# Patient Record
Sex: Female | Born: 1935 | Hispanic: No | Smoking: Former smoker
Health system: Southern US, Community
[De-identification: ages and names within clinical notes are randomized; demographics above are authoritative.]

## PROBLEM LIST (undated history)

## (undated) DIAGNOSIS — J449 Chronic obstructive pulmonary disease, unspecified: Secondary | ICD-10-CM

## (undated) DIAGNOSIS — C3490 Malignant neoplasm of unspecified part of unspecified bronchus or lung: Secondary | ICD-10-CM

## (undated) DIAGNOSIS — G629 Polyneuropathy, unspecified: Secondary | ICD-10-CM

## (undated) HISTORY — PX: VAGINAL HYSTERECTOMY: SUR661

## (undated) HISTORY — DX: Chronic obstructive pulmonary disease, unspecified: J44.9

## (undated) HISTORY — DX: Polyneuropathy, unspecified: G62.9

## (undated) HISTORY — DX: Malignant neoplasm of unspecified part of unspecified bronchus or lung: C34.90

## (undated) HISTORY — PX: OTHER SURGICAL HISTORY: SHX169

---

## 1990-04-28 HISTORY — PX: OTHER SURGICAL HISTORY: SHX169

## 1992-11-26 HISTORY — PX: OTHER SURGICAL HISTORY: SHX169

## 2013-05-02 DIAGNOSIS — I4891 Unspecified atrial fibrillation: Secondary | ICD-10-CM | POA: Diagnosis not present

## 2013-05-03 DIAGNOSIS — J449 Chronic obstructive pulmonary disease, unspecified: Secondary | ICD-10-CM | POA: Diagnosis not present

## 2013-05-03 DIAGNOSIS — C3430 Malignant neoplasm of lower lobe, unspecified bronchus or lung: Secondary | ICD-10-CM | POA: Diagnosis not present

## 2013-05-03 DIAGNOSIS — I4891 Unspecified atrial fibrillation: Secondary | ICD-10-CM | POA: Diagnosis not present

## 2013-06-02 DIAGNOSIS — H409 Unspecified glaucoma: Secondary | ICD-10-CM | POA: Diagnosis not present

## 2013-06-02 DIAGNOSIS — H4011X0 Primary open-angle glaucoma, stage unspecified: Secondary | ICD-10-CM | POA: Diagnosis not present

## 2013-07-19 DIAGNOSIS — J309 Allergic rhinitis, unspecified: Secondary | ICD-10-CM | POA: Diagnosis not present

## 2013-07-21 DIAGNOSIS — J209 Acute bronchitis, unspecified: Secondary | ICD-10-CM | POA: Diagnosis not present

## 2013-08-02 DIAGNOSIS — C3430 Malignant neoplasm of lower lobe, unspecified bronchus or lung: Secondary | ICD-10-CM | POA: Diagnosis not present

## 2013-08-02 DIAGNOSIS — J449 Chronic obstructive pulmonary disease, unspecified: Secondary | ICD-10-CM | POA: Diagnosis not present

## 2013-08-02 DIAGNOSIS — Z79899 Other long term (current) drug therapy: Secondary | ICD-10-CM | POA: Diagnosis not present

## 2013-08-12 DIAGNOSIS — E7800 Pure hypercholesterolemia, unspecified: Secondary | ICD-10-CM | POA: Diagnosis not present

## 2013-08-12 DIAGNOSIS — C3430 Malignant neoplasm of lower lobe, unspecified bronchus or lung: Secondary | ICD-10-CM | POA: Diagnosis not present

## 2013-08-12 DIAGNOSIS — I4891 Unspecified atrial fibrillation: Secondary | ICD-10-CM | POA: Diagnosis not present

## 2013-08-12 DIAGNOSIS — J9801 Acute bronchospasm: Secondary | ICD-10-CM | POA: Diagnosis not present

## 2013-08-22 DIAGNOSIS — R823 Hemoglobinuria: Secondary | ICD-10-CM | POA: Diagnosis not present

## 2013-08-22 DIAGNOSIS — R3129 Other microscopic hematuria: Secondary | ICD-10-CM | POA: Diagnosis not present

## 2013-09-08 DIAGNOSIS — H409 Unspecified glaucoma: Secondary | ICD-10-CM | POA: Diagnosis not present

## 2013-09-08 DIAGNOSIS — H4011X0 Primary open-angle glaucoma, stage unspecified: Secondary | ICD-10-CM | POA: Diagnosis not present

## 2013-09-15 DIAGNOSIS — G609 Hereditary and idiopathic neuropathy, unspecified: Secondary | ICD-10-CM | POA: Diagnosis not present

## 2013-09-15 DIAGNOSIS — R209 Unspecified disturbances of skin sensation: Secondary | ICD-10-CM | POA: Diagnosis not present

## 2013-09-18 DIAGNOSIS — R51 Headache: Secondary | ICD-10-CM | POA: Diagnosis not present

## 2013-09-26 DIAGNOSIS — B029 Zoster without complications: Secondary | ICD-10-CM | POA: Diagnosis not present

## 2013-10-03 DIAGNOSIS — K7689 Other specified diseases of liver: Secondary | ICD-10-CM | POA: Diagnosis not present

## 2013-10-03 DIAGNOSIS — R222 Localized swelling, mass and lump, trunk: Secondary | ICD-10-CM | POA: Diagnosis not present

## 2013-10-03 DIAGNOSIS — N35919 Unspecified urethral stricture, male, unspecified site: Secondary | ICD-10-CM | POA: Diagnosis not present

## 2013-10-03 DIAGNOSIS — R3129 Other microscopic hematuria: Secondary | ICD-10-CM | POA: Diagnosis not present

## 2013-10-03 DIAGNOSIS — J984 Other disorders of lung: Secondary | ICD-10-CM | POA: Diagnosis not present

## 2013-10-05 DIAGNOSIS — H66009 Acute suppurative otitis media without spontaneous rupture of ear drum, unspecified ear: Secondary | ICD-10-CM | POA: Diagnosis not present

## 2013-10-05 DIAGNOSIS — J019 Acute sinusitis, unspecified: Secondary | ICD-10-CM | POA: Diagnosis not present

## 2013-10-21 DIAGNOSIS — G609 Hereditary and idiopathic neuropathy, unspecified: Secondary | ICD-10-CM | POA: Diagnosis not present

## 2013-10-21 DIAGNOSIS — R209 Unspecified disturbances of skin sensation: Secondary | ICD-10-CM | POA: Diagnosis not present

## 2013-10-26 DIAGNOSIS — B029 Zoster without complications: Secondary | ICD-10-CM | POA: Diagnosis not present

## 2013-11-01 DIAGNOSIS — C3490 Malignant neoplasm of unspecified part of unspecified bronchus or lung: Secondary | ICD-10-CM | POA: Diagnosis not present

## 2013-11-01 DIAGNOSIS — R222 Localized swelling, mass and lump, trunk: Secondary | ICD-10-CM | POA: Diagnosis not present

## 2013-11-01 DIAGNOSIS — J449 Chronic obstructive pulmonary disease, unspecified: Secondary | ICD-10-CM | POA: Diagnosis not present

## 2013-11-14 DIAGNOSIS — N35919 Unspecified urethral stricture, male, unspecified site: Secondary | ICD-10-CM | POA: Diagnosis not present

## 2013-11-22 DIAGNOSIS — I4891 Unspecified atrial fibrillation: Secondary | ICD-10-CM | POA: Diagnosis not present

## 2013-11-22 DIAGNOSIS — E785 Hyperlipidemia, unspecified: Secondary | ICD-10-CM | POA: Diagnosis not present

## 2013-11-22 DIAGNOSIS — J449 Chronic obstructive pulmonary disease, unspecified: Secondary | ICD-10-CM | POA: Diagnosis not present

## 2013-12-21 DIAGNOSIS — R209 Unspecified disturbances of skin sensation: Secondary | ICD-10-CM | POA: Diagnosis not present

## 2013-12-21 DIAGNOSIS — G609 Hereditary and idiopathic neuropathy, unspecified: Secondary | ICD-10-CM | POA: Diagnosis not present

## 2013-12-27 DIAGNOSIS — C3430 Malignant neoplasm of lower lobe, unspecified bronchus or lung: Secondary | ICD-10-CM | POA: Diagnosis not present

## 2013-12-27 DIAGNOSIS — I4891 Unspecified atrial fibrillation: Secondary | ICD-10-CM | POA: Diagnosis not present

## 2013-12-27 DIAGNOSIS — Z79899 Other long term (current) drug therapy: Secondary | ICD-10-CM | POA: Diagnosis not present

## 2013-12-27 DIAGNOSIS — J449 Chronic obstructive pulmonary disease, unspecified: Secondary | ICD-10-CM | POA: Diagnosis not present

## 2014-01-20 DIAGNOSIS — H353190 Nonexudative age-related macular degeneration, unspecified eye, stage unspecified: Secondary | ICD-10-CM | POA: Diagnosis not present

## 2014-01-20 DIAGNOSIS — H4011X0 Primary open-angle glaucoma, stage unspecified: Secondary | ICD-10-CM | POA: Diagnosis not present

## 2014-01-20 DIAGNOSIS — H409 Unspecified glaucoma: Secondary | ICD-10-CM | POA: Diagnosis not present

## 2014-01-26 DIAGNOSIS — I4891 Unspecified atrial fibrillation: Secondary | ICD-10-CM | POA: Diagnosis not present

## 2014-02-10 DIAGNOSIS — Z1231 Encounter for screening mammogram for malignant neoplasm of breast: Secondary | ICD-10-CM | POA: Diagnosis not present

## 2014-02-20 DIAGNOSIS — R312 Other microscopic hematuria: Secondary | ICD-10-CM | POA: Diagnosis not present

## 2014-03-01 DIAGNOSIS — I4891 Unspecified atrial fibrillation: Secondary | ICD-10-CM | POA: Diagnosis not present

## 2014-03-01 DIAGNOSIS — E780 Pure hypercholesterolemia: Secondary | ICD-10-CM | POA: Diagnosis not present

## 2014-03-01 DIAGNOSIS — J449 Chronic obstructive pulmonary disease, unspecified: Secondary | ICD-10-CM | POA: Diagnosis not present

## 2014-03-01 DIAGNOSIS — Z23 Encounter for immunization: Secondary | ICD-10-CM | POA: Diagnosis not present

## 2014-03-01 DIAGNOSIS — Z0000 Encounter for general adult medical examination without abnormal findings: Secondary | ICD-10-CM | POA: Diagnosis not present

## 2014-03-01 DIAGNOSIS — E559 Vitamin D deficiency, unspecified: Secondary | ICD-10-CM | POA: Diagnosis not present

## 2014-03-28 DIAGNOSIS — J449 Chronic obstructive pulmonary disease, unspecified: Secondary | ICD-10-CM | POA: Diagnosis not present

## 2014-03-28 DIAGNOSIS — C3430 Malignant neoplasm of lower lobe, unspecified bronchus or lung: Secondary | ICD-10-CM | POA: Diagnosis not present

## 2014-03-30 DIAGNOSIS — H401430 Capsular glaucoma with pseudoexfoliation of lens, bilateral, stage unspecified: Secondary | ICD-10-CM | POA: Diagnosis not present

## 2014-03-30 DIAGNOSIS — Q100 Congenital ptosis: Secondary | ICD-10-CM | POA: Diagnosis not present

## 2014-03-30 DIAGNOSIS — H4011X3 Primary open-angle glaucoma, severe stage: Secondary | ICD-10-CM | POA: Diagnosis not present

## 2014-04-06 DIAGNOSIS — H02421 Myogenic ptosis of right eyelid: Secondary | ICD-10-CM | POA: Diagnosis not present

## 2014-04-10 DIAGNOSIS — H4011X3 Primary open-angle glaucoma, severe stage: Secondary | ICD-10-CM | POA: Diagnosis not present

## 2014-06-01 DIAGNOSIS — N3010 Interstitial cystitis (chronic) without hematuria: Secondary | ICD-10-CM | POA: Diagnosis not present

## 2014-06-01 DIAGNOSIS — R300 Dysuria: Secondary | ICD-10-CM | POA: Diagnosis not present

## 2014-06-01 DIAGNOSIS — R358 Other polyuria: Secondary | ICD-10-CM | POA: Diagnosis not present

## 2014-06-19 DIAGNOSIS — H02411 Mechanical ptosis of right eyelid: Secondary | ICD-10-CM | POA: Diagnosis not present

## 2014-06-19 DIAGNOSIS — H4011X3 Primary open-angle glaucoma, severe stage: Secondary | ICD-10-CM | POA: Diagnosis not present

## 2014-06-21 DIAGNOSIS — I480 Paroxysmal atrial fibrillation: Secondary | ICD-10-CM | POA: Diagnosis not present

## 2014-07-03 DIAGNOSIS — H02421 Myogenic ptosis of right eyelid: Secondary | ICD-10-CM | POA: Diagnosis not present

## 2014-07-05 DIAGNOSIS — T161XXA Foreign body in right ear, initial encounter: Secondary | ICD-10-CM | POA: Diagnosis not present

## 2014-07-14 DIAGNOSIS — N3010 Interstitial cystitis (chronic) without hematuria: Secondary | ICD-10-CM | POA: Diagnosis not present

## 2014-07-26 DIAGNOSIS — M9903 Segmental and somatic dysfunction of lumbar region: Secondary | ICD-10-CM | POA: Diagnosis not present

## 2014-07-26 DIAGNOSIS — M9904 Segmental and somatic dysfunction of sacral region: Secondary | ICD-10-CM | POA: Diagnosis not present

## 2014-07-26 DIAGNOSIS — M9905 Segmental and somatic dysfunction of pelvic region: Secondary | ICD-10-CM | POA: Diagnosis not present

## 2014-07-26 DIAGNOSIS — M5136 Other intervertebral disc degeneration, lumbar region: Secondary | ICD-10-CM | POA: Diagnosis not present

## 2014-07-27 DIAGNOSIS — M9903 Segmental and somatic dysfunction of lumbar region: Secondary | ICD-10-CM | POA: Diagnosis not present

## 2014-07-27 DIAGNOSIS — M5136 Other intervertebral disc degeneration, lumbar region: Secondary | ICD-10-CM | POA: Diagnosis not present

## 2014-07-27 DIAGNOSIS — M9904 Segmental and somatic dysfunction of sacral region: Secondary | ICD-10-CM | POA: Diagnosis not present

## 2014-07-27 DIAGNOSIS — M9905 Segmental and somatic dysfunction of pelvic region: Secondary | ICD-10-CM | POA: Diagnosis not present

## 2014-07-31 DIAGNOSIS — M9904 Segmental and somatic dysfunction of sacral region: Secondary | ICD-10-CM | POA: Diagnosis not present

## 2014-07-31 DIAGNOSIS — M9905 Segmental and somatic dysfunction of pelvic region: Secondary | ICD-10-CM | POA: Diagnosis not present

## 2014-07-31 DIAGNOSIS — M9903 Segmental and somatic dysfunction of lumbar region: Secondary | ICD-10-CM | POA: Diagnosis not present

## 2014-07-31 DIAGNOSIS — M5136 Other intervertebral disc degeneration, lumbar region: Secondary | ICD-10-CM | POA: Diagnosis not present

## 2014-08-01 DIAGNOSIS — J449 Chronic obstructive pulmonary disease, unspecified: Secondary | ICD-10-CM | POA: Diagnosis not present

## 2014-08-01 DIAGNOSIS — C3430 Malignant neoplasm of lower lobe, unspecified bronchus or lung: Secondary | ICD-10-CM | POA: Diagnosis not present

## 2014-08-03 DIAGNOSIS — M9904 Segmental and somatic dysfunction of sacral region: Secondary | ICD-10-CM | POA: Diagnosis not present

## 2014-08-03 DIAGNOSIS — M9903 Segmental and somatic dysfunction of lumbar region: Secondary | ICD-10-CM | POA: Diagnosis not present

## 2014-08-03 DIAGNOSIS — M9905 Segmental and somatic dysfunction of pelvic region: Secondary | ICD-10-CM | POA: Diagnosis not present

## 2014-08-03 DIAGNOSIS — M5136 Other intervertebral disc degeneration, lumbar region: Secondary | ICD-10-CM | POA: Diagnosis not present

## 2014-08-10 DIAGNOSIS — M5136 Other intervertebral disc degeneration, lumbar region: Secondary | ICD-10-CM | POA: Diagnosis not present

## 2014-08-10 DIAGNOSIS — M9904 Segmental and somatic dysfunction of sacral region: Secondary | ICD-10-CM | POA: Diagnosis not present

## 2014-08-10 DIAGNOSIS — M9905 Segmental and somatic dysfunction of pelvic region: Secondary | ICD-10-CM | POA: Diagnosis not present

## 2014-08-10 DIAGNOSIS — M9903 Segmental and somatic dysfunction of lumbar region: Secondary | ICD-10-CM | POA: Diagnosis not present

## 2014-08-30 DIAGNOSIS — E780 Pure hypercholesterolemia: Secondary | ICD-10-CM | POA: Diagnosis not present

## 2014-08-30 DIAGNOSIS — E559 Vitamin D deficiency, unspecified: Secondary | ICD-10-CM | POA: Diagnosis not present

## 2014-08-30 DIAGNOSIS — I10 Essential (primary) hypertension: Secondary | ICD-10-CM | POA: Diagnosis not present

## 2014-09-04 DIAGNOSIS — M5136 Other intervertebral disc degeneration, lumbar region: Secondary | ICD-10-CM | POA: Diagnosis not present

## 2014-09-04 DIAGNOSIS — M9904 Segmental and somatic dysfunction of sacral region: Secondary | ICD-10-CM | POA: Diagnosis not present

## 2014-09-04 DIAGNOSIS — M9903 Segmental and somatic dysfunction of lumbar region: Secondary | ICD-10-CM | POA: Diagnosis not present

## 2014-09-04 DIAGNOSIS — M9905 Segmental and somatic dysfunction of pelvic region: Secondary | ICD-10-CM | POA: Diagnosis not present

## 2014-09-05 DIAGNOSIS — M5136 Other intervertebral disc degeneration, lumbar region: Secondary | ICD-10-CM | POA: Diagnosis not present

## 2014-09-05 DIAGNOSIS — M9903 Segmental and somatic dysfunction of lumbar region: Secondary | ICD-10-CM | POA: Diagnosis not present

## 2014-09-05 DIAGNOSIS — M9905 Segmental and somatic dysfunction of pelvic region: Secondary | ICD-10-CM | POA: Diagnosis not present

## 2014-09-05 DIAGNOSIS — M9904 Segmental and somatic dysfunction of sacral region: Secondary | ICD-10-CM | POA: Diagnosis not present

## 2014-09-06 DIAGNOSIS — M5136 Other intervertebral disc degeneration, lumbar region: Secondary | ICD-10-CM | POA: Diagnosis not present

## 2014-09-06 DIAGNOSIS — M9904 Segmental and somatic dysfunction of sacral region: Secondary | ICD-10-CM | POA: Diagnosis not present

## 2014-09-06 DIAGNOSIS — M9905 Segmental and somatic dysfunction of pelvic region: Secondary | ICD-10-CM | POA: Diagnosis not present

## 2014-09-06 DIAGNOSIS — M9903 Segmental and somatic dysfunction of lumbar region: Secondary | ICD-10-CM | POA: Diagnosis not present

## 2014-10-05 DIAGNOSIS — H02413 Mechanical ptosis of bilateral eyelids: Secondary | ICD-10-CM | POA: Diagnosis not present

## 2014-10-13 DIAGNOSIS — N3010 Interstitial cystitis (chronic) without hematuria: Secondary | ICD-10-CM | POA: Diagnosis not present

## 2014-11-03 ENCOUNTER — Ambulatory Visit (INDEPENDENT_AMBULATORY_CARE_PROVIDER_SITE_OTHER): Payer: Medicare Other | Admitting: Pulmonary Disease

## 2014-11-03 ENCOUNTER — Encounter (INDEPENDENT_AMBULATORY_CARE_PROVIDER_SITE_OTHER): Payer: Self-pay

## 2014-11-03 ENCOUNTER — Encounter: Payer: Self-pay | Admitting: Pulmonary Disease

## 2014-11-03 VITALS — BP 124/68 | HR 81 | Ht 63.0 in | Wt 135.0 lb

## 2014-11-03 DIAGNOSIS — G609 Hereditary and idiopathic neuropathy, unspecified: Secondary | ICD-10-CM

## 2014-11-03 DIAGNOSIS — J479 Bronchiectasis, uncomplicated: Secondary | ICD-10-CM | POA: Diagnosis not present

## 2014-11-03 DIAGNOSIS — J439 Emphysema, unspecified: Secondary | ICD-10-CM | POA: Insufficient documentation

## 2014-11-03 DIAGNOSIS — M792 Neuralgia and neuritis, unspecified: Secondary | ICD-10-CM | POA: Insufficient documentation

## 2014-11-03 DIAGNOSIS — C3400 Malignant neoplasm of unspecified main bronchus: Secondary | ICD-10-CM | POA: Diagnosis not present

## 2014-11-03 DIAGNOSIS — J438 Other emphysema: Secondary | ICD-10-CM | POA: Diagnosis not present

## 2014-11-03 DIAGNOSIS — C3490 Malignant neoplasm of unspecified part of unspecified bronchus or lung: Secondary | ICD-10-CM | POA: Insufficient documentation

## 2014-11-03 MED ORDER — GABAPENTIN 300 MG PO CAPS: 600.0000 mg | ORAL_CAPSULE | Freq: Three times a day (TID) | ORAL | Status: DC

## 2014-11-03 NOTE — Patient Instructions (Signed)
Continue using the pro-air inhaler as needed for shortness of breath We will request records from your prior pulmonologist We will see you back in 4 months or sooner if needed

## 2014-11-03 NOTE — Progress Notes (Signed)
Subjective:    Patient ID: Jocelyn Ramirez, female    DOB: 1929-02-25, 81 y.o.   MRN: [id number]  HPI Chief Complaint  Patient presents with  . Advice Only    Pt establishing care after relocating.  Pt has hx of lung ca.      This is a very pleasant 81 year old lady originally from [place] who comes to my clinic today to establish care for COPD bronchiectasis and lung cancer. She tells me that as a child she had no respiratory illnesses of which she is aware.  Unfortunately she was diagnosed with lung cancer in the 1990s after she had a persistent cough. This was treated with a lobectomy and she says she has not had recurrence since.  She knows she has a diagnosis of COPD but she has minimal symptoms. She says that she only gets short of breath when she climbs multiple hills. Walking on level ground is not difficult for her. She remains quite active. She takes albuterol 3 or 4 times a year at most. She says most inhalers last her more than 18 months at a time. She does not take any other inhaled therapies. She gets bronchitis about one time per year. She has previously been followed by a pulmonologist in a neighboring city.  She has peripheral neuropathy and she is asking that we refill her gabapentin.  She recently [place] to [place].    Past Medical History  Diagnosis Date  . COPD (chronic obstructive pulmonary disease)   . Lung cancer   . Peripheral neuropathy      No family history on file.   History   Social History  . Marital Status: Divorced    Spouse Name: N/A  . Number of Children: N/A  . Years of Education: N/A   Occupational History  . Not on file.   Social History Main Topics  . Smoking status: Former Smoker -- 0.50 packs/day for 35 years    Types: Cigarettes    Quit date: 04/29/1983  . Smokeless tobacco: Never Used  . Alcohol Use: Not on file  . Drug Use: Not on file  . Sexual Activity: Not on file   Other Topics  Concern  . Not on file   Social History Narrative  . No narrative on file     Allergies  Allergen Reactions  . Penicillins      No outpatient prescriptions prior to visit.   No facility-administered medications prior to visit.       Review of Systems  Constitutional: Positive for fatigue. Negative for fever and unexpected weight change.  HENT: Negative for congestion, dental problem, ear pain, nosebleeds, postnasal drip, rhinorrhea, sinus pressure, sneezing, sore throat and trouble swallowing.   Eyes: Negative for redness and itching.  Respiratory: Positive for shortness of breath. Negative for cough, chest tightness and wheezing.   Cardiovascular: Negative for palpitations and leg swelling.  Gastrointestinal: Negative for nausea and vomiting.  Genitourinary: Negative for dysuria.  Musculoskeletal: Negative for joint swelling.  Skin: Negative for rash.  Neurological: Negative for headaches.  Hematological: Does not bruise/bleed easily.  Psychiatric/Behavioral: Negative for dysphoric mood. The patient is not nervous/anxious.        Objective:   Physical Exam Filed Vitals:   11/03/14 1000  BP: 124/68  Pulse: 81  Height: '5\' 3"'$  (1.6 m)  Weight: 135 lb (61.236 kg)  SpO2: 95%  RA  Gen: well appearing, no acute distress HENT: NCAT, OP clear, neck  supple without masses Eyes: PERRL, EOMi Lymph: no cervical lymphadenopathy PULM: Crackles in bases bilaterally, good air movement CV: RRR, no mgr, no JVD GI: BS+, soft, nontender, no hsm Derm: no rash or skin breakdown, trace edema MSK: normal bulk and tone Neuro: A&Ox4, CN II-XII intact, strength 5/5 in all 4 extremities Psyche: normal mood and affect  2016 CT chest images personally reviewed showing emphysema and lower lobe bronchiectasis with scattered nodules. Overall apperance suggestive of MAI     Assessment & Plan:  Lung cancer She has a history of lung cancer treated with a lobectomy in the 1990s. She has not  had evidence of recurrence. We will obtain records from her prior pulmonologist for the most recent imaging.  COPD with emphysema I have personally reviewed the images from a CT chest from this year which shows emphysema as well as lower lobe bronchiectasis and micronodules likely suggestive of a chronic atypical infection. I do not know the severity of her COPD but it sounds as if she has mild symptoms and only one exacerbation per year.  Plan: Obtain records from her pulmonologist for her most recent pulmonary function testing Continue albuterol as needed Flu shot in the fall  Bronchiectasis without acute exacerbation As noted above she has evidence of bronchiectasis but it sounds as if she has mild symptoms at best. She may have one exacerbation per year.  Plan: Obtain records from prior pulmonologist for microbiology results If she produces sputum we will collect a sample for culture Continue as needed albuterol.  Peripheral neuralgia She is requesting a refill on her gabapentin. I will refill it at her current dosing and I have asked her to follow-up with her primary care physician regarding this.     Current outpatient prescriptions:  .  atorvastatin (LIPITOR) 10 MG tablet, Take 10 mg by mouth daily., Disp: , Rfl:  .  Brinzolamide-Brimonidine (SIMBRINZA) 1-0.2 % SUSP, Apply 1 drop to eye 3 (three) times daily., Disp: , Rfl:  .  dabigatran (PRADAXA) 150 MG CAPS capsule, Take 150 mg by mouth 2 (two) times daily., Disp: , Rfl:  .  diazepam (VALIUM) 10 MG tablet, Take 10 mg by mouth daily., Disp: , Rfl:  .  gabapentin (NEURONTIN) 300 MG capsule, Take 2 capsules (600 mg total) by mouth 3 (three) times daily., Disp: 180 capsule, Rfl: 1 .  oxybutynin (DITROPAN) 5 MG tablet, Take 5 mg by mouth daily., Disp: , Rfl:  .  timolol (TIMOPTIC) 0.25 % ophthalmic solution, Place 1 drop into both eyes 2 (two) times daily., Disp: , Rfl:  .  Travoprost, BAK Free, (TRAVATAN) 0.004 % SOLN ophthalmic  solution, Place 1 drop into both eyes at bedtime., Disp: , Rfl:

## 2014-11-03 NOTE — Assessment & Plan Note (Signed)
I have personally reviewed the images from a CT chest from this year which shows emphysema as well as lower lobe bronchiectasis and micronodules likely suggestive of a chronic atypical infection. I do not know the severity of her COPD but it sounds as if she has mild symptoms and only one exacerbation per year.  Plan: Obtain records from her pulmonologist for her most recent pulmonary function testing Continue albuterol as needed Flu shot in the fall

## 2014-11-03 NOTE — Assessment & Plan Note (Signed)
She is requesting a refill on her gabapentin. I will refill it at her current dosing and I have asked her to follow-up with her primary care physician regarding this.

## 2014-11-03 NOTE — Assessment & Plan Note (Signed)
As noted above she has evidence of bronchiectasis but it sounds as if she has mild symptoms at best. She may have one exacerbation per year.  Plan: Obtain records from prior pulmonologist for microbiology results If she produces sputum we will collect a sample for culture Continue as needed albuterol.

## 2014-11-03 NOTE — Assessment & Plan Note (Signed)
She has a history of lung cancer treated with a lobectomy in the 1990s. She has not had evidence of recurrence. We will obtain records from her prior pulmonologist for the most recent imaging.

## 2014-11-23 DIAGNOSIS — I482 Chronic atrial fibrillation: Secondary | ICD-10-CM | POA: Diagnosis not present

## 2014-11-23 DIAGNOSIS — E559 Vitamin D deficiency, unspecified: Secondary | ICD-10-CM | POA: Diagnosis not present

## 2014-11-23 DIAGNOSIS — J449 Chronic obstructive pulmonary disease, unspecified: Secondary | ICD-10-CM | POA: Diagnosis not present

## 2014-11-23 DIAGNOSIS — N393 Stress incontinence (female) (male): Secondary | ICD-10-CM | POA: Diagnosis not present

## 2014-11-23 DIAGNOSIS — G629 Polyneuropathy, unspecified: Secondary | ICD-10-CM | POA: Diagnosis not present

## 2014-11-23 DIAGNOSIS — E785 Hyperlipidemia, unspecified: Secondary | ICD-10-CM | POA: Diagnosis not present

## 2015-01-16 DIAGNOSIS — Z6825 Body mass index (BMI) 25.0-25.9, adult: Secondary | ICD-10-CM | POA: Diagnosis not present

## 2015-01-16 DIAGNOSIS — E785 Hyperlipidemia, unspecified: Secondary | ICD-10-CM | POA: Diagnosis not present

## 2015-01-16 DIAGNOSIS — I481 Persistent atrial fibrillation: Secondary | ICD-10-CM | POA: Diagnosis not present

## 2015-01-17 DIAGNOSIS — Z23 Encounter for immunization: Secondary | ICD-10-CM | POA: Diagnosis not present

## 2015-01-17 DIAGNOSIS — G609 Hereditary and idiopathic neuropathy, unspecified: Secondary | ICD-10-CM | POA: Diagnosis not present

## 2015-01-17 DIAGNOSIS — M8588 Other specified disorders of bone density and structure, other site: Secondary | ICD-10-CM | POA: Diagnosis not present

## 2015-02-07 DIAGNOSIS — M8589 Other specified disorders of bone density and structure, multiple sites: Secondary | ICD-10-CM | POA: Diagnosis not present

## 2015-02-09 ENCOUNTER — Ambulatory Visit (INDEPENDENT_AMBULATORY_CARE_PROVIDER_SITE_OTHER)
Admission: RE | Admit: 2015-02-09 | Discharge: 2015-02-09 | Disposition: A | Discharge status: Home / Self Care | Payer: Medicare Other | Source: Ambulatory Visit | Attending: Adult Health | Admitting: Adult Health

## 2015-02-09 ENCOUNTER — Encounter: Payer: Self-pay | Admitting: Adult Health

## 2015-02-09 ENCOUNTER — Ambulatory Visit (INDEPENDENT_AMBULATORY_CARE_PROVIDER_SITE_OTHER): Payer: Medicare Other | Admitting: Adult Health

## 2015-02-09 VITALS — BP 130/80 | HR 80 | Temp 98.7°F | Ht 63.0 in | Wt 147.0 lb

## 2015-02-09 DIAGNOSIS — J439 Emphysema, unspecified: Secondary | ICD-10-CM | POA: Diagnosis not present

## 2015-02-09 DIAGNOSIS — J479 Bronchiectasis, uncomplicated: Secondary | ICD-10-CM | POA: Diagnosis not present

## 2015-02-09 DIAGNOSIS — R0602 Shortness of breath: Secondary | ICD-10-CM | POA: Diagnosis not present

## 2015-02-09 MED ORDER — PREDNISONE 10 MG PO TABS: ORAL_TABLET | ORAL | Status: DC

## 2015-02-09 MED ORDER — DOXYCYCLINE HYCLATE 100 MG PO TABS: 100.0000 mg | ORAL_TABLET | Freq: Two times a day (BID) | ORAL | Status: DC

## 2015-02-09 NOTE — Patient Instructions (Addendum)
Doxycycline '100mg'$  Twice daily  For 1 week. -take w/ food.  Mucinex DM Twice daily  As needed  Cough/congestion  Prednisone taper over next week.  Chest xray today .  Follow up with Dr Lake Bells in 6-8 weeks and As needed   Please contact office for sooner follow up if symptoms do not improve or worsen or seek emergency care

## 2015-02-09 NOTE — Progress Notes (Signed)
   Subjective:    Patient ID: Jocelyn Ramirez, female    DOB: Dec 21, 1928, 81 y.o.   MRN: [id number]  HPI 81 yo [place] female former smoker with COPD and Bronchiectasis .  Hx of Lung Cancer (1994 ) s/p RLL lobectomy   02/09/2015 Follow up : COPD /Bronchiectasis  Pt returns for 4 month follow up . She says she has noticed more cough and congestion over last several weeks. Says she cough with thick mucus but hard to get up .Does has some sinus drainage . She feels that her cough is getting worse. She denies any hemoptysis, fever, chest pain, orthopnea, PND or leg swelling She has tried some Mucinex DM.   Marland Kitchen    Review of Systems Constitutional:   No  weight loss, night sweats,  Fevers, chills, fatigue, or  lassitude.  HEENT:   No headaches,  Difficulty swallowing,  Tooth/dental problems, or  Sore throat,                No sneezing, itching, ear ache,  +nasal congestion, post nasal drip,   CV:  No chest pain,  Orthopnea, PND, swelling in lower extremities, anasarca, dizziness, palpitations, syncope.   GI  No heartburn, indigestion, abdominal pain, nausea, vomiting, diarrhea, change in bowel habits, loss of appetite, bloody stools.   Resp:   No chest wall deformity  Skin: no rash or lesions.  GU: no dysuria, change in color of urine, no urgency or frequency.  No flank pain, no hematuria   MS:  No joint pain or swelling.  No decreased range of motion.  No back pain.  Psych:  No change in mood or affect. No depression or anxiety.  No memory loss.         Objective:   Physical Exam GEN: A/Ox3; pleasant , NAD, elderly HEENT:  [place]/AT,  EACs-clear, TMs-wnl, NOSE-clear, THROAT-clear, no lesions, no postnasal drip or exudate noted.   NECK:  Supple w/ fair ROM; no JVD; normal carotid impulses w/o bruits; no thyromegaly or nodules palpated; no lymphadenopathy.  RESP few trace rhonchi no accessory muscle use, no dullness to percussion  CARD:  RRR, no m/r/g  , no peripheral edema,  pulses intact, no cyanosis or clubbing.  GI:   Soft & nt; nml bowel sounds; no organomegaly or masses detected.  Musco: Warm bil, no deformities or joint swelling noted.   Neuro: alert, no focal deficits noted.    Skin: Warm, no lesions or rashes         Assessment & Plan:

## 2015-02-12 ENCOUNTER — Telehealth: Payer: Self-pay | Admitting: Adult Health

## 2015-02-12 NOTE — Telephone Encounter (Signed)
lmomtcb x1 

## 2015-02-12 NOTE — Telephone Encounter (Signed)
Pt returned call  She stated that she is taking Diltiazem daily for her heart, but Diazepam continues to appear on her medication list.  Pt is very insistent that she has been on this before and is quite adamant about it being removed.    Of note, it appears this medication was added during abstraction process prior to her initial visit Diazepam removed from pt's med list Nothing further needed; will sign off

## 2015-02-12 NOTE — Progress Notes (Signed)
Quick Note:  Called and spoke with pt. Reviewed results and recs. Pt voiced understanding and had no further questions. ______ 

## 2015-02-16 NOTE — Assessment & Plan Note (Signed)
Flare   Plan   Doxycycline '100mg'$  Twice daily  For 1 week. -take w/ food.  Mucinex DM Twice daily  As needed  Cough/congestion  Prednisone taper over next week.  Chest xray today .  Follow up with Dr Lake Bells in 6-8 weeks and As needed   Please contact office for sooner follow up if symptoms do not improve or worsen or seek emergency care

## 2015-02-19 ENCOUNTER — Telehealth: Payer: Self-pay | Admitting: Adult Health

## 2015-02-19 NOTE — Telephone Encounter (Signed)
Pt is aware of recs below. She reports it is not better. She will call PCP and push fluids. Nothing further needed

## 2015-02-19 NOTE — Telephone Encounter (Signed)
Spoke with pt, states that since taking the pred taper and abx given by TP on 10/14 pt has had progressive dizziness only while standing.  Pt can sit and lay down with no problems.  Pt did not start any other new meds during this time. Pt believes this is from the prednisone and not the doxycycline.  Pt states she's taken prednisone in the past and never has had this reaction.  S/s have improved since stopping the abx and pred taper but are still present.  Pt is requesting further recs from our office regarding the dizziness.   Pt uses [facility] on [address].    TP please advise on recs.  Thanks!

## 2015-02-19 NOTE — Telephone Encounter (Signed)
Is she better since stopping  If not will need to be seen by PCP  Prednisone does have a lot of side effects so it is possible rec push fluids that can really help  Please contact office for sooner follow up if symptoms do not improve or worsen or seek emergency care

## 2015-03-01 DIAGNOSIS — H401133 Primary open-angle glaucoma, bilateral, severe stage: Secondary | ICD-10-CM | POA: Diagnosis not present

## 2015-03-01 DIAGNOSIS — H353132 Nonexudative age-related macular degeneration, bilateral, intermediate dry stage: Secondary | ICD-10-CM | POA: Diagnosis not present

## 2015-03-07 DIAGNOSIS — M9905 Segmental and somatic dysfunction of pelvic region: Secondary | ICD-10-CM | POA: Diagnosis not present

## 2015-03-07 DIAGNOSIS — M9904 Segmental and somatic dysfunction of sacral region: Secondary | ICD-10-CM | POA: Diagnosis not present

## 2015-03-07 DIAGNOSIS — M5136 Other intervertebral disc degeneration, lumbar region: Secondary | ICD-10-CM | POA: Diagnosis not present

## 2015-03-07 DIAGNOSIS — M9903 Segmental and somatic dysfunction of lumbar region: Secondary | ICD-10-CM | POA: Diagnosis not present

## 2015-03-09 DIAGNOSIS — M9905 Segmental and somatic dysfunction of pelvic region: Secondary | ICD-10-CM | POA: Diagnosis not present

## 2015-03-09 DIAGNOSIS — M9904 Segmental and somatic dysfunction of sacral region: Secondary | ICD-10-CM | POA: Diagnosis not present

## 2015-03-09 DIAGNOSIS — M9903 Segmental and somatic dysfunction of lumbar region: Secondary | ICD-10-CM | POA: Diagnosis not present

## 2015-03-09 DIAGNOSIS — M5136 Other intervertebral disc degeneration, lumbar region: Secondary | ICD-10-CM | POA: Diagnosis not present

## 2015-03-12 DIAGNOSIS — M5136 Other intervertebral disc degeneration, lumbar region: Secondary | ICD-10-CM | POA: Diagnosis not present

## 2015-03-12 DIAGNOSIS — M9904 Segmental and somatic dysfunction of sacral region: Secondary | ICD-10-CM | POA: Diagnosis not present

## 2015-03-12 DIAGNOSIS — M9905 Segmental and somatic dysfunction of pelvic region: Secondary | ICD-10-CM | POA: Diagnosis not present

## 2015-03-12 DIAGNOSIS — M9903 Segmental and somatic dysfunction of lumbar region: Secondary | ICD-10-CM | POA: Diagnosis not present

## 2015-03-15 DIAGNOSIS — M9905 Segmental and somatic dysfunction of pelvic region: Secondary | ICD-10-CM | POA: Diagnosis not present

## 2015-03-15 DIAGNOSIS — M9904 Segmental and somatic dysfunction of sacral region: Secondary | ICD-10-CM | POA: Diagnosis not present

## 2015-03-15 DIAGNOSIS — M5136 Other intervertebral disc degeneration, lumbar region: Secondary | ICD-10-CM | POA: Diagnosis not present

## 2015-03-15 DIAGNOSIS — M9903 Segmental and somatic dysfunction of lumbar region: Secondary | ICD-10-CM | POA: Diagnosis not present

## 2015-03-30 ENCOUNTER — Ambulatory Visit (INDEPENDENT_AMBULATORY_CARE_PROVIDER_SITE_OTHER): Payer: Medicare Other | Admitting: Pulmonary Disease

## 2015-03-30 ENCOUNTER — Encounter: Payer: Self-pay | Admitting: Pulmonary Disease

## 2015-03-30 VITALS — BP 122/70 | HR 82 | Ht 63.0 in | Wt 150.0 lb

## 2015-03-30 DIAGNOSIS — J479 Bronchiectasis, uncomplicated: Secondary | ICD-10-CM

## 2015-03-30 NOTE — Assessment & Plan Note (Signed)
She has been a bit more symptomatic in the last few weeks with the cold weather, but she is definitely better than she was when she saw Tammy last month. She is wheezing on exam today so I think her airway obstruction from bronchiectasis is not adequately controlled.  Plan: Add Advair twice a day, samples provided Continue albuterol as needed Follow-up 3-4 months or sooner if needed

## 2015-03-30 NOTE — Patient Instructions (Signed)
Take the Advair 1 puff twice a day no matter how you feel Call me after taking the Advair sample, if it is working I will call in a prescription for you Keep using albuterol on an as-needed basis I will see you back in 3-4 months or sooner if needed

## 2015-03-30 NOTE — Progress Notes (Signed)
Subjective:    Patient ID: Jocelyn Ramirez, female    DOB: 10/08/1928, 81 y.o.   MRN: [id number]  Synopsis: First came to the [facility] in 2016 for evaluation of idiopathic bronchiectasis. 2016 CT chest images personally reviewed showing emphysema and lower lobe bronchiectasis with scattered nodules. Overall apperance suggestive of MAI She was previously seen by a pulmonologist named Dr. Glade Lloyd in [facility].   HPI Chief Complaint  Patient presents with  . Follow-up    pt c/o increased SOB since cold weather has come in.  cough has improved since seeing TP on 01/2015.     Jocelyn Ramirez has not been doing as well with the cold weather recently.  She says that she was definitely doing better in the afternoon and in the warm weather.  She is using the proAir on an as needed basis.  This summer she never needed it but she says that she is using it more frequently in the winter, never more than twice per day.   Yesterday in particular was a hard day and she had.   She saw Tammy in October and was treated with prednisone and an antibiotic for a flare of bronchiectasis.  She felt better after that.  She says that the mucus was clear. She took Advair once twice a day Rx'd by her PCP (she was given a sample) and said "life was good" and it was like "I walked out of the dark into the light".     Past Medical History  Diagnosis Date  . COPD (chronic obstructive pulmonary disease) ([place])   . Lung cancer ([place])   . Peripheral neuropathy (HCC)       Review of Systems  Constitutional: Negative for fever, chills and fatigue.  HENT: Negative for rhinorrhea, sinus pressure and sneezing.   Respiratory: Positive for cough and shortness of breath. Negative for wheezing.   Cardiovascular: Positive for leg swelling. Negative for chest pain and palpitations.       Objective:   Physical Exam  Filed Vitals:   03/30/15 1354  BP: 122/70  Pulse: 82  Height: '5\' 3"'$  (1.6 m)  Weight: 150 lb (68.04 kg)    SpO2: 97%   RA  Gen: well appearing HENT: OP clear, TM's clear, neck supple PULM: Wheeze left upper lobe, normal effort CV: RRR, no mgr, trace edema GI: BS+, soft, nontender Derm: no cyanosis or rash Psyche: normal mood and affect       Assessment & Plan:  No problem-specific assessment & plan notes found for this encounter.    Current outpatient prescriptions:  .  albuterol (PROAIR HFA) 108 (90 BASE) MCG/ACT inhaler, Take 1 puff by mouth every 4 (four) hours as needed., Disp: , Rfl:  .  atorvastatin (LIPITOR) 10 MG tablet, Take 10 mg by mouth daily., Disp: , Rfl:  .  Brinzolamide-Brimonidine (SIMBRINZA) 1-0.2 % SUSP, Apply 1 drop to eye 3 (three) times daily., Disp: , Rfl:  .  dabigatran (PRADAXA) 150 MG CAPS capsule, Take 150 mg by mouth 2 (two) times daily., Disp: , Rfl:  .  diltiazem (CARDIZEM CD) 120 MG 24 hr capsule, Take 1 capsule by mouth daily., Disp: , Rfl:  .  EXTRA STRENGTH ACETAMINOPHEN PO, Take 1 tablet by mouth as needed., Disp: , Rfl:  .  gabapentin (NEURONTIN) 300 MG capsule, Take 2 capsules (600 mg total) by mouth 3 (three) times daily., Disp: 180 capsule, Rfl: 1 .  hydroxypropyl methylcellulose / hypromellose (ISOPTO TEARS / GONIOVISC) 2.5 %  ophthalmic solution, Place 1 drop into both eyes as needed for dry eyes., Disp: , Rfl:  .  oxybutynin (DITROPAN) 5 MG tablet, Take 5 mg by mouth daily., Disp: , Rfl:  .  timolol (TIMOPTIC) 0.25 % ophthalmic solution, Place 1 drop into both eyes 2 (two) times daily., Disp: , Rfl:  .  Travoprost, BAK Free, (TRAVATAN) 0.004 % SOLN ophthalmic solution, Place 1 drop into both eyes at bedtime., Disp: , Rfl:

## 2015-04-02 DIAGNOSIS — N3010 Interstitial cystitis (chronic) without hematuria: Secondary | ICD-10-CM | POA: Diagnosis not present

## 2015-04-12 ENCOUNTER — Telehealth: Payer: Self-pay | Admitting: Pulmonary Disease

## 2015-04-12 MED ORDER — FLUTICASONE-SALMETEROL 250-50 MCG/DOSE IN AEPB: 1.0000 | INHALATION_SPRAY | Freq: Two times a day (BID) | RESPIRATORY_TRACT | Status: DC

## 2015-04-12 NOTE — Telephone Encounter (Signed)
Spoke with pt. She was given a sample of Advair 250-50 when she was here last. Would like to have a prescription sent in for this. This has been taken care of. Nothing further was needed.

## 2015-04-16 DIAGNOSIS — N3010 Interstitial cystitis (chronic) without hematuria: Secondary | ICD-10-CM | POA: Diagnosis not present

## 2015-05-03 DIAGNOSIS — N3010 Interstitial cystitis (chronic) without hematuria: Secondary | ICD-10-CM | POA: Diagnosis not present

## 2015-07-02 ENCOUNTER — Ambulatory Visit (INDEPENDENT_AMBULATORY_CARE_PROVIDER_SITE_OTHER): Payer: Medicare Other | Admitting: Pulmonary Disease

## 2015-07-02 ENCOUNTER — Encounter: Payer: Self-pay | Admitting: Pulmonary Disease

## 2015-07-02 VITALS — BP 124/68 | HR 71 | Ht 63.0 in | Wt 146.0 lb

## 2015-07-02 DIAGNOSIS — J479 Bronchiectasis, uncomplicated: Secondary | ICD-10-CM | POA: Diagnosis not present

## 2015-07-02 NOTE — Assessment & Plan Note (Signed)
This has been a stable interval for [place].  She has been doing well with the Advair and has not had a major complication or a flare up. She has stayed healthy this year.  Plan: We reviewed basic infection prevention measures for this time of year Stay active Continue Advair F/U 6 months

## 2015-07-02 NOTE — Patient Instructions (Signed)
Keep taking the Advair twice a day as you're doing We will see you back in 6 months or sooner if needed

## 2015-07-02 NOTE — Progress Notes (Signed)
Subjective:    Patient ID: Jocelyn Ramirez, female    DOB: 1928/06/01, 82 y.o.   MRN: [id number]  Synopsis: First came to the [facility] in 2016 for evaluation of idiopathic bronchiectasis. 2016 CT chest images personally reviewed showing emphysema and lower lobe bronchiectasis with scattered nodules. Overall apperance suggestive of MAI She was previously seen by a pulmonologist named Dr. Glade Ramirez in [facility].   HPI Chief Complaint  Patient presents with  . Follow-up    pt states she is doing well since starting advair.  does not some hoarseness upon arrival.      Jocelyn Ramirez traveled to [place] recently and had a good trip.   She didn't have any breathing trouble when she went on her trip.  She said that "they were put to the limit" when she visit [place] and had to climb a lot of hills getting around.  She said that her dyspnea limited her som, but not too bad.  Sh says that the Advair has really made a difference with her dyspnea.    Past Medical History  Diagnosis Date  . COPD (chronic obstructive pulmonary disease) ([place])   . Lung cancer ([place])   . Peripheral neuropathy (HCC)       Review of Systems  Constitutional: Negative for fever, chills and fatigue.  HENT: Negative for rhinorrhea, sinus pressure and sneezing.   Respiratory: Positive for cough and shortness of breath. Negative for wheezing.   Cardiovascular: Positive for leg swelling. Negative for chest pain and palpitations.       Objective:   Physical Exam  Filed Vitals:   07/02/15 1425  BP: 124/68  Pulse: 71  Height: '5\' 3"'$  (1.6 m)  Weight: 146 lb (66.225 kg)  SpO2: 96%   RA  Gen: well appearing HENT: OP clear, TM's clear, neck supple PULM: Wheeze right lower lobe only, normal effort CV: RRR, no mgr, trace edema GI: BS+, soft, nontender Derm: no cyanosis or rash Psyche: normal mood and affect       Assessment & Plan:  Bronchiectasis without acute exacerbation (HCC) This has been a stable  interval for Jocelyn Ramirez.  She has been doing well with the Advair and has not had a major complication or a flare up. She has stayed healthy this year.  Plan: We reviewed basic infection prevention measures for this time of year Stay active Continue Advair F/U 6 months     Current outpatient prescriptions:  .  atorvastatin (LIPITOR) 10 MG tablet, Take 10 mg by mouth daily., Disp: , Rfl:  .  Brinzolamide-Brimonidine (SIMBRINZA) 1-0.2 % SUSP, Apply 1 drop to eye 3 (three) times daily., Disp: , Rfl:  .  dabigatran (PRADAXA) 150 MG CAPS capsule, Take 150 mg by mouth 2 (two) times daily., Disp: , Rfl:  .  diltiazem (CARDIZEM CD) 120 MG 24 hr capsule, Take 1 capsule by mouth daily., Disp: , Rfl:  .  EXTRA STRENGTH ACETAMINOPHEN PO, Take 1 tablet by mouth as needed., Disp: , Rfl:  .  Fluticasone-Salmeterol (ADVAIR DISKUS) 250-50 MCG/DOSE AEPB, Inhale 1 puff into the lungs 2 (two) times daily., Disp: 60 each, Rfl: 5 .  gabapentin (NEURONTIN) 300 MG capsule, Take 2 capsules (600 mg total) by mouth 3 (three) times daily., Disp: 180 capsule, Rfl: 1 .  hydroxypropyl methylcellulose / hypromellose (ISOPTO TEARS / GONIOVISC) 2.5 % ophthalmic solution, Place 1 drop into both eyes as needed for dry eyes., Disp: , Rfl:  .  oxybutynin (DITROPAN) 5 MG tablet, Take 5  mg by mouth daily., Disp: , Rfl:  .  timolol (TIMOPTIC) 0.25 % ophthalmic solution, Place 1 drop into both eyes 2 (two) times daily., Disp: , Rfl:  .  Travoprost, BAK Free, (TRAVATAN) 0.004 % SOLN ophthalmic solution, Place 1 drop into both eyes at bedtime., Disp: , Rfl:

## 2015-07-04 DIAGNOSIS — J449 Chronic obstructive pulmonary disease, unspecified: Secondary | ICD-10-CM | POA: Diagnosis not present

## 2015-07-04 DIAGNOSIS — G629 Polyneuropathy, unspecified: Secondary | ICD-10-CM | POA: Diagnosis not present

## 2015-07-04 DIAGNOSIS — J479 Bronchiectasis, uncomplicated: Secondary | ICD-10-CM | POA: Diagnosis not present

## 2015-07-04 DIAGNOSIS — I482 Chronic atrial fibrillation: Secondary | ICD-10-CM | POA: Diagnosis not present

## 2015-07-04 DIAGNOSIS — E785 Hyperlipidemia, unspecified: Secondary | ICD-10-CM | POA: Diagnosis not present

## 2015-07-05 DIAGNOSIS — H353132 Nonexudative age-related macular degeneration, bilateral, intermediate dry stage: Secondary | ICD-10-CM | POA: Diagnosis not present

## 2015-07-05 DIAGNOSIS — H401133 Primary open-angle glaucoma, bilateral, severe stage: Secondary | ICD-10-CM | POA: Diagnosis not present

## 2015-08-02 DIAGNOSIS — N3010 Interstitial cystitis (chronic) without hematuria: Secondary | ICD-10-CM | POA: Diagnosis not present

## 2015-08-16 DIAGNOSIS — H353132 Nonexudative age-related macular degeneration, bilateral, intermediate dry stage: Secondary | ICD-10-CM | POA: Diagnosis not present

## 2015-08-16 DIAGNOSIS — H401133 Primary open-angle glaucoma, bilateral, severe stage: Secondary | ICD-10-CM | POA: Diagnosis not present

## 2015-08-17 ENCOUNTER — Telehealth: Payer: Self-pay | Admitting: Pulmonary Disease

## 2015-08-17 MED ORDER — FLUTICASONE-SALMETEROL 250-50 MCG/DOSE IN AEPB: 1.0000 | INHALATION_SPRAY | Freq: Two times a day (BID) | RESPIRATORY_TRACT | Status: DC

## 2015-08-17 NOTE — Telephone Encounter (Signed)
Pt returning call.Jocelyn Ramirez ° °

## 2015-08-17 NOTE — Telephone Encounter (Signed)
lmtcb X1 for pt  

## 2015-08-17 NOTE — Telephone Encounter (Signed)
Spoke with pt and informed that Advair 3 mth supply was sent to [facility].

## 2015-08-27 DIAGNOSIS — I481 Persistent atrial fibrillation: Secondary | ICD-10-CM | POA: Diagnosis not present

## 2015-08-27 DIAGNOSIS — Z6825 Body mass index (BMI) 25.0-25.9, adult: Secondary | ICD-10-CM | POA: Diagnosis not present

## 2015-08-27 DIAGNOSIS — E785 Hyperlipidemia, unspecified: Secondary | ICD-10-CM | POA: Diagnosis not present

## 2015-09-25 DIAGNOSIS — M5136 Other intervertebral disc degeneration, lumbar region: Secondary | ICD-10-CM | POA: Diagnosis not present

## 2015-09-25 DIAGNOSIS — M9905 Segmental and somatic dysfunction of pelvic region: Secondary | ICD-10-CM | POA: Diagnosis not present

## 2015-09-25 DIAGNOSIS — M9903 Segmental and somatic dysfunction of lumbar region: Secondary | ICD-10-CM | POA: Diagnosis not present

## 2015-09-25 DIAGNOSIS — M9904 Segmental and somatic dysfunction of sacral region: Secondary | ICD-10-CM | POA: Diagnosis not present

## 2015-09-28 DIAGNOSIS — M9905 Segmental and somatic dysfunction of pelvic region: Secondary | ICD-10-CM | POA: Diagnosis not present

## 2015-09-28 DIAGNOSIS — M9904 Segmental and somatic dysfunction of sacral region: Secondary | ICD-10-CM | POA: Diagnosis not present

## 2015-09-28 DIAGNOSIS — M9903 Segmental and somatic dysfunction of lumbar region: Secondary | ICD-10-CM | POA: Diagnosis not present

## 2015-09-28 DIAGNOSIS — M5136 Other intervertebral disc degeneration, lumbar region: Secondary | ICD-10-CM | POA: Diagnosis not present

## 2015-10-03 DIAGNOSIS — M9904 Segmental and somatic dysfunction of sacral region: Secondary | ICD-10-CM | POA: Diagnosis not present

## 2015-10-03 DIAGNOSIS — M9905 Segmental and somatic dysfunction of pelvic region: Secondary | ICD-10-CM | POA: Diagnosis not present

## 2015-10-03 DIAGNOSIS — M5136 Other intervertebral disc degeneration, lumbar region: Secondary | ICD-10-CM | POA: Diagnosis not present

## 2015-10-03 DIAGNOSIS — M9903 Segmental and somatic dysfunction of lumbar region: Secondary | ICD-10-CM | POA: Diagnosis not present

## 2015-10-12 DIAGNOSIS — E1140 Type 2 diabetes mellitus with diabetic neuropathy, unspecified: Secondary | ICD-10-CM | POA: Diagnosis not present

## 2015-10-19 DIAGNOSIS — E1140 Type 2 diabetes mellitus with diabetic neuropathy, unspecified: Secondary | ICD-10-CM | POA: Diagnosis not present

## 2015-11-01 DIAGNOSIS — E1140 Type 2 diabetes mellitus with diabetic neuropathy, unspecified: Secondary | ICD-10-CM | POA: Diagnosis not present

## 2015-11-05 DIAGNOSIS — N3010 Interstitial cystitis (chronic) without hematuria: Secondary | ICD-10-CM | POA: Diagnosis not present

## 2015-11-05 DIAGNOSIS — R300 Dysuria: Secondary | ICD-10-CM | POA: Diagnosis not present

## 2015-11-05 DIAGNOSIS — R358 Other polyuria: Secondary | ICD-10-CM | POA: Diagnosis not present

## 2015-11-06 DIAGNOSIS — M5136 Other intervertebral disc degeneration, lumbar region: Secondary | ICD-10-CM | POA: Diagnosis not present

## 2015-11-06 DIAGNOSIS — M9905 Segmental and somatic dysfunction of pelvic region: Secondary | ICD-10-CM | POA: Diagnosis not present

## 2015-11-06 DIAGNOSIS — E1140 Type 2 diabetes mellitus with diabetic neuropathy, unspecified: Secondary | ICD-10-CM | POA: Diagnosis not present

## 2015-11-06 DIAGNOSIS — M9903 Segmental and somatic dysfunction of lumbar region: Secondary | ICD-10-CM | POA: Diagnosis not present

## 2015-11-06 DIAGNOSIS — M9904 Segmental and somatic dysfunction of sacral region: Secondary | ICD-10-CM | POA: Diagnosis not present

## 2015-11-08 DIAGNOSIS — M9905 Segmental and somatic dysfunction of pelvic region: Secondary | ICD-10-CM | POA: Diagnosis not present

## 2015-11-08 DIAGNOSIS — M9904 Segmental and somatic dysfunction of sacral region: Secondary | ICD-10-CM | POA: Diagnosis not present

## 2015-11-08 DIAGNOSIS — M9903 Segmental and somatic dysfunction of lumbar region: Secondary | ICD-10-CM | POA: Diagnosis not present

## 2015-11-08 DIAGNOSIS — M5136 Other intervertebral disc degeneration, lumbar region: Secondary | ICD-10-CM | POA: Diagnosis not present

## 2015-11-09 DIAGNOSIS — M9903 Segmental and somatic dysfunction of lumbar region: Secondary | ICD-10-CM | POA: Diagnosis not present

## 2015-11-09 DIAGNOSIS — M9904 Segmental and somatic dysfunction of sacral region: Secondary | ICD-10-CM | POA: Diagnosis not present

## 2015-11-09 DIAGNOSIS — M5136 Other intervertebral disc degeneration, lumbar region: Secondary | ICD-10-CM | POA: Diagnosis not present

## 2015-11-09 DIAGNOSIS — E1140 Type 2 diabetes mellitus with diabetic neuropathy, unspecified: Secondary | ICD-10-CM | POA: Diagnosis not present

## 2015-11-09 DIAGNOSIS — M9905 Segmental and somatic dysfunction of pelvic region: Secondary | ICD-10-CM | POA: Diagnosis not present

## 2015-11-19 DIAGNOSIS — M9904 Segmental and somatic dysfunction of sacral region: Secondary | ICD-10-CM | POA: Diagnosis not present

## 2015-11-19 DIAGNOSIS — M5136 Other intervertebral disc degeneration, lumbar region: Secondary | ICD-10-CM | POA: Diagnosis not present

## 2015-11-19 DIAGNOSIS — M9905 Segmental and somatic dysfunction of pelvic region: Secondary | ICD-10-CM | POA: Diagnosis not present

## 2015-11-19 DIAGNOSIS — M9903 Segmental and somatic dysfunction of lumbar region: Secondary | ICD-10-CM | POA: Diagnosis not present

## 2015-11-22 DIAGNOSIS — N3010 Interstitial cystitis (chronic) without hematuria: Secondary | ICD-10-CM | POA: Diagnosis not present

## 2015-11-23 DIAGNOSIS — E1140 Type 2 diabetes mellitus with diabetic neuropathy, unspecified: Secondary | ICD-10-CM | POA: Diagnosis not present

## 2015-11-26 DIAGNOSIS — M9904 Segmental and somatic dysfunction of sacral region: Secondary | ICD-10-CM | POA: Diagnosis not present

## 2015-11-26 DIAGNOSIS — M5136 Other intervertebral disc degeneration, lumbar region: Secondary | ICD-10-CM | POA: Diagnosis not present

## 2015-11-26 DIAGNOSIS — M9903 Segmental and somatic dysfunction of lumbar region: Secondary | ICD-10-CM | POA: Diagnosis not present

## 2015-11-26 DIAGNOSIS — M9905 Segmental and somatic dysfunction of pelvic region: Secondary | ICD-10-CM | POA: Diagnosis not present

## 2015-11-28 DIAGNOSIS — E1140 Type 2 diabetes mellitus with diabetic neuropathy, unspecified: Secondary | ICD-10-CM | POA: Diagnosis not present

## 2015-12-04 DIAGNOSIS — E1140 Type 2 diabetes mellitus with diabetic neuropathy, unspecified: Secondary | ICD-10-CM | POA: Diagnosis not present

## 2015-12-11 DIAGNOSIS — E1140 Type 2 diabetes mellitus with diabetic neuropathy, unspecified: Secondary | ICD-10-CM | POA: Diagnosis not present

## 2015-12-18 DIAGNOSIS — E1140 Type 2 diabetes mellitus with diabetic neuropathy, unspecified: Secondary | ICD-10-CM | POA: Diagnosis not present

## 2015-12-24 DIAGNOSIS — E785 Hyperlipidemia, unspecified: Secondary | ICD-10-CM | POA: Diagnosis not present

## 2015-12-24 DIAGNOSIS — G629 Polyneuropathy, unspecified: Secondary | ICD-10-CM | POA: Diagnosis not present

## 2015-12-24 DIAGNOSIS — N3281 Overactive bladder: Secondary | ICD-10-CM | POA: Diagnosis not present

## 2015-12-24 DIAGNOSIS — I482 Chronic atrial fibrillation: Secondary | ICD-10-CM | POA: Diagnosis not present

## 2015-12-24 DIAGNOSIS — C3490 Malignant neoplasm of unspecified part of unspecified bronchus or lung: Secondary | ICD-10-CM | POA: Diagnosis not present

## 2015-12-24 DIAGNOSIS — M8588 Other specified disorders of bone density and structure, other site: Secondary | ICD-10-CM | POA: Diagnosis not present

## 2015-12-24 DIAGNOSIS — J449 Chronic obstructive pulmonary disease, unspecified: Secondary | ICD-10-CM | POA: Diagnosis not present

## 2015-12-24 DIAGNOSIS — Z23 Encounter for immunization: Secondary | ICD-10-CM | POA: Diagnosis not present

## 2015-12-25 DIAGNOSIS — E1140 Type 2 diabetes mellitus with diabetic neuropathy, unspecified: Secondary | ICD-10-CM | POA: Diagnosis not present

## 2016-01-01 ENCOUNTER — Encounter: Payer: Self-pay | Admitting: Pulmonary Disease

## 2016-01-01 ENCOUNTER — Ambulatory Visit (INDEPENDENT_AMBULATORY_CARE_PROVIDER_SITE_OTHER): Payer: Medicare Other | Admitting: Pulmonary Disease

## 2016-01-01 DIAGNOSIS — J479 Bronchiectasis, uncomplicated: Secondary | ICD-10-CM | POA: Diagnosis not present

## 2016-01-01 DIAGNOSIS — J309 Allergic rhinitis, unspecified: Secondary | ICD-10-CM | POA: Diagnosis not present

## 2016-01-01 MED ORDER — AEROCHAMBER MV MISC: 0 refills | Status: AC

## 2016-01-01 MED ORDER — FLUTICASONE-SALMETEROL 115-21 MCG/ACT IN AERO: 2.0000 | INHALATION_SPRAY | Freq: Two times a day (BID) | RESPIRATORY_TRACT | 0 refills | Status: DC

## 2016-01-01 NOTE — Addendum Note (Signed)
Addended by: Len Blalock on: 01/01/2016 02:12 PM   Modules accepted: Orders

## 2016-01-01 NOTE — Progress Notes (Signed)
Attending:  I have seen and examined the patient with Eric Form and I agree with the findings from her note  Some hoarseness but breathing is okay  On exam: Lungs are clear to auscultation normal effort Regular rate and rhythm no murmurs gallops or rubs  Asthma:/Bronchiectasis: Stable interval continue Advair but will change to [facility] with spacer Postnasal drip allergic rhinitis continue nasal steroid and anti-history  We recommend that you use the [place]ta Deiters Med rinse twice a day as needed for your nasal congestion.  Make sure that you use distilled water for this.  Roselie Awkward, MD [facility] Pager: [phone] Cell: [phone] After 3pm or if no response, call [phone]

## 2016-01-01 NOTE — Assessment & Plan Note (Addendum)
  Well Controlled Bronchiectasis Stable interval Plan: Continue using your Advair Inhaler two  puffs twice  daily. We will switch you to the mist form of the Advair  We will also prescribe a spacer and teach you how to use it. Remember to rinse your mouth after use. This may help with your hoarseness. Follow up with Dr. Lake Bells in 6 months. Add Claritin 10 mg once daily Add Flonase nasal Spray 1 puff in each nare  Remember to get your Flu vaccine in October through your PCP. Please contact office for sooner follow up if symptoms do not improve or worsen or seek emergency care

## 2016-01-01 NOTE — Assessment & Plan Note (Signed)
Seasonal Allergies: Plan Add Claritin 10 mg once daily Add Flonase 1 puff each nare once dily as needed for nasal stuffiness.

## 2016-01-01 NOTE — Progress Notes (Addendum)
History of Present Illness Jocelyn Ramirez is a 82 y.o. female with COPD, bronchiectasis and  followed by Dr. Lake Bells  HPI: Synopsis: First came to the [facility] in 2016 for evaluation of idiopathic bronchiectasis. 2016 CT chest images personally reviewed showing emphysema and lower lobe bronchiectasis with scattered nodules. Overall apperance suggestive of MAI She was previously seen by a pulmonologist named Dr. Glade Lloyd in [facility].   01/01/2016 Follow Up OV: Pt. Presents to the office today. She is doing well. She has recently been switched to Advair which she feels has been a good change from her. She does have a hoarse voice which she feels is due to the Advair inhaler. She is not coughing anything up that is not her baseline secretions. She feels the Advair helps her cough up what is in her lungs each day. She states secretions are clear.No chest pain, fever, orthopnea or hemoptysis. She does have some shortness of breath with climbing an incline. She has not had to use her rescue inhaler however..    Past medical hx Past Medical History:  Diagnosis Date  . COPD (chronic obstructive pulmonary disease) ([place])   . Lung cancer (Jocelyn Ramirez)   . Peripheral neuropathy (HCC)      Past surgical hx, Family hx, Social hx all reviewed.  Current Outpatient Prescriptions on File Prior to Visit  Medication Sig  . atorvastatin (LIPITOR) 10 MG tablet Take 10 mg by mouth daily.  . Brinzolamide-Brimonidine (SIMBRINZA) 1-0.2 % SUSP Apply 1 drop to eye 3 (three) times daily.  . dabigatran (PRADAXA) 150 MG CAPS capsule Take 150 mg by mouth 2 (two) times daily.  Marland Kitchen diltiazem (CARDIZEM CD) 120 MG 24 hr capsule Take 1 capsule by mouth daily.  Marland Kitchen EXTRA STRENGTH ACETAMINOPHEN PO Take 1 tablet by mouth as needed.  . gabapentin (NEURONTIN) 300 MG capsule Take 2 capsules (600 mg total) by mouth 3 (three) times daily.  . hydroxypropyl methylcellulose / hypromellose (ISOPTO TEARS / GONIOVISC) 2.5 % ophthalmic  solution Place 1 drop into both eyes as needed for dry eyes.  Marland Kitchen oxybutynin (DITROPAN) 5 MG tablet Take 5 mg by mouth daily.  . timolol (TIMOPTIC) 0.25 % ophthalmic solution Place 1 drop into both eyes 2 (two) times daily.   No current facility-administered medications on file prior to visit.      Allergies  Allergen Reactions  . Apixaban Nausea And Vomiting  . Levofloxacin   . Oxycodone Nausea And Vomiting  . Penicillins     Review Of Systems:  Constitutional:   No  weight loss, night sweats,  Fevers, chills, fatigue, or  lassitude.  HEENT:   No headaches,  Difficulty swallowing,  Tooth/dental problems, or  Sore throat,                No sneezing, itching, ear ache, +nasal congestion,+ post nasal drip, hoarse voice  CV:  No chest pain,  Orthopnea, PND, swelling in lower extremities, anasarca, dizziness, palpitations, syncope.   GI  No heartburn, indigestion, abdominal pain, nausea, vomiting, diarrhea, change in bowel habits, loss of appetite, bloody stools.   Resp: No shortness of breath with exertion or at rest.  No excess mucus, no productive cough,  No non-productive cough,  No coughing up of blood.  No change in color of mucus.  No wheezing.  No chest wall deformity  Skin: no rash or lesions.  GU: no dysuria, change in color of urine, no urgency or frequency.  No flank pain, no hematuria  MS:  No joint pain or swelling.  No decreased range of motion.  No back pain.  Psych:  No change in mood or affect. No depression or anxiety.  No memory loss.   Vital Signs BP 118/76 (BP Location: Left Arm, Cuff Size: Normal)   Pulse 72   Ht '5\' 3"'$  (1.6 m)   Wt 145 lb (65.8 kg)   SpO2 94%   BMI 25.69 kg/m    Physical Exam:  General- No distress,  A&Ox3, pleasant ENT: No sinus tenderness, TM clear, pale nasal mucosa, no oral exudate,+ post nasal drip, no LAN Cardiac: S1, S2, regular rate and rhythm, no murmur Chest: No wheeze/ rales/ dullness; no accessory muscle use, no nasal  flaring, no sternal retractions Abd.: Soft Non-tender Ext: No clubbing cyanosis, edema Neuro:  normal strength Skin: No rashes, warm and dry Psych: normal mood and behavior   Assessment/Plan  Bronchiectasis without acute exacerbation (HCC)  Well Controlled Bronchiectasis Stable interval Plan: Continue using your Advair Inhaler two  puffs twice  daily. We will switch you to the mist form of the Advair  We will also prescribe a spacer and teach you how to use it. Remember to rinse your mouth after use. This may help with your hoarseness. Follow up with Dr. Lake Bells in 6 months. Add Claritin 10 mg once daily Add Flonase nasal Spray 1 puff in each nare  Remember to get your Flu vaccine in October through your PCP. Please contact office for sooner follow up if symptoms do not improve or worsen or seek emergency care    Allergic rhinitis Seasonal Allergies: Plan Add Claritin 10 mg once daily Add Flonase 1 puff each nare once dily as needed for nasal stuffiness.    Jocelyn Spatz, NP 01/01/2016  2:21 PM

## 2016-01-01 NOTE — Patient Instructions (Addendum)
It is nice to meet you today. Continue using your Advair Inhaler two puffs twice daily. We will change this to the mist form to help with your hoarse voice. Remember to rinse your mouth after use. Follow up with Dr. Lake Bells in 6 months. Add Claritin 10 mg once daily Add Flonase nasal Spray 1 puff in each nare  Remember to get your Flu vaccine in October through your PCP. Please contact office for sooner follow up if symptoms do not improve or worsen or seek emergency care

## 2016-02-04 DIAGNOSIS — G609 Hereditary and idiopathic neuropathy, unspecified: Secondary | ICD-10-CM | POA: Diagnosis not present

## 2016-02-04 DIAGNOSIS — J449 Chronic obstructive pulmonary disease, unspecified: Secondary | ICD-10-CM | POA: Diagnosis not present

## 2016-02-04 DIAGNOSIS — C3490 Malignant neoplasm of unspecified part of unspecified bronchus or lung: Secondary | ICD-10-CM | POA: Diagnosis not present

## 2016-02-04 DIAGNOSIS — Z23 Encounter for immunization: Secondary | ICD-10-CM | POA: Diagnosis not present

## 2016-02-04 DIAGNOSIS — J479 Bronchiectasis, uncomplicated: Secondary | ICD-10-CM | POA: Diagnosis not present

## 2016-02-04 DIAGNOSIS — Z1389 Encounter for screening for other disorder: Secondary | ICD-10-CM | POA: Diagnosis not present

## 2016-02-04 DIAGNOSIS — N3281 Overactive bladder: Secondary | ICD-10-CM | POA: Diagnosis not present

## 2016-02-04 DIAGNOSIS — M8588 Other specified disorders of bone density and structure, other site: Secondary | ICD-10-CM | POA: Diagnosis not present

## 2016-02-04 DIAGNOSIS — Z0000 Encounter for general adult medical examination without abnormal findings: Secondary | ICD-10-CM | POA: Diagnosis not present

## 2016-02-04 DIAGNOSIS — E785 Hyperlipidemia, unspecified: Secondary | ICD-10-CM | POA: Diagnosis not present

## 2016-02-11 ENCOUNTER — Other Ambulatory Visit: Payer: Self-pay | Admitting: Internal Medicine

## 2016-02-11 DIAGNOSIS — Z1231 Encounter for screening mammogram for malignant neoplasm of breast: Secondary | ICD-10-CM

## 2016-02-14 DIAGNOSIS — M5136 Other intervertebral disc degeneration, lumbar region: Secondary | ICD-10-CM | POA: Diagnosis not present

## 2016-02-14 DIAGNOSIS — M9905 Segmental and somatic dysfunction of pelvic region: Secondary | ICD-10-CM | POA: Diagnosis not present

## 2016-02-14 DIAGNOSIS — M9903 Segmental and somatic dysfunction of lumbar region: Secondary | ICD-10-CM | POA: Diagnosis not present

## 2016-02-14 DIAGNOSIS — M9904 Segmental and somatic dysfunction of sacral region: Secondary | ICD-10-CM | POA: Diagnosis not present

## 2016-02-15 DIAGNOSIS — M9905 Segmental and somatic dysfunction of pelvic region: Secondary | ICD-10-CM | POA: Diagnosis not present

## 2016-02-15 DIAGNOSIS — M9904 Segmental and somatic dysfunction of sacral region: Secondary | ICD-10-CM | POA: Diagnosis not present

## 2016-02-15 DIAGNOSIS — M9903 Segmental and somatic dysfunction of lumbar region: Secondary | ICD-10-CM | POA: Diagnosis not present

## 2016-02-15 DIAGNOSIS — M5136 Other intervertebral disc degeneration, lumbar region: Secondary | ICD-10-CM | POA: Diagnosis not present

## 2016-02-18 DIAGNOSIS — M9903 Segmental and somatic dysfunction of lumbar region: Secondary | ICD-10-CM | POA: Diagnosis not present

## 2016-02-18 DIAGNOSIS — M9904 Segmental and somatic dysfunction of sacral region: Secondary | ICD-10-CM | POA: Diagnosis not present

## 2016-02-18 DIAGNOSIS — M5136 Other intervertebral disc degeneration, lumbar region: Secondary | ICD-10-CM | POA: Diagnosis not present

## 2016-02-18 DIAGNOSIS — M9905 Segmental and somatic dysfunction of pelvic region: Secondary | ICD-10-CM | POA: Diagnosis not present

## 2016-02-20 DIAGNOSIS — M9904 Segmental and somatic dysfunction of sacral region: Secondary | ICD-10-CM | POA: Diagnosis not present

## 2016-02-20 DIAGNOSIS — M9905 Segmental and somatic dysfunction of pelvic region: Secondary | ICD-10-CM | POA: Diagnosis not present

## 2016-02-20 DIAGNOSIS — M9903 Segmental and somatic dysfunction of lumbar region: Secondary | ICD-10-CM | POA: Diagnosis not present

## 2016-02-20 DIAGNOSIS — M5136 Other intervertebral disc degeneration, lumbar region: Secondary | ICD-10-CM | POA: Diagnosis not present

## 2016-02-21 DIAGNOSIS — H353132 Nonexudative age-related macular degeneration, bilateral, intermediate dry stage: Secondary | ICD-10-CM | POA: Diagnosis not present

## 2016-02-21 DIAGNOSIS — H401133 Primary open-angle glaucoma, bilateral, severe stage: Secondary | ICD-10-CM | POA: Diagnosis not present

## 2016-02-22 ENCOUNTER — Ambulatory Visit
Admission: RE | Admit: 2016-02-22 | Discharge: 2016-02-22 | Disposition: A | Discharge status: Home / Self Care | Payer: Medicare Other | Source: Ambulatory Visit | Attending: Internal Medicine | Admitting: Internal Medicine

## 2016-02-22 DIAGNOSIS — Z1231 Encounter for screening mammogram for malignant neoplasm of breast: Secondary | ICD-10-CM | POA: Diagnosis not present

## 2016-02-28 DIAGNOSIS — N3010 Interstitial cystitis (chronic) without hematuria: Secondary | ICD-10-CM | POA: Diagnosis not present

## 2016-03-05 ENCOUNTER — Telehealth: Payer: Self-pay | Admitting: Pulmonary Disease

## 2016-03-05 MED ORDER — FLUTICASONE-SALMETEROL 115-21 MCG/ACT IN AERO: 2.0000 | INHALATION_SPRAY | Freq: Two times a day (BID) | RESPIRATORY_TRACT | 3 refills | Status: DC

## 2016-03-05 NOTE — Telephone Encounter (Signed)
Rx sent to preferred pharmacy. Pt aware & voiced understanding. Nothing further needed.

## 2016-03-10 ENCOUNTER — Telehealth: Payer: Self-pay | Admitting: Pulmonary Disease

## 2016-03-10 MED ORDER — FLUTICASONE-SALMETEROL 115-21 MCG/ACT IN AERO: 2.0000 | INHALATION_SPRAY | Freq: Two times a day (BID) | RESPIRATORY_TRACT | 3 refills | Status: DC

## 2016-03-10 NOTE — Telephone Encounter (Signed)
Pt states [facility] did not receive her rx for advair sent on 11/8. Per chart, this was ordered under "sample".  This rx has been resent correctly to verified pharmacy.   Nothing further needed.

## 2016-03-17 DIAGNOSIS — H524 Presbyopia: Secondary | ICD-10-CM | POA: Diagnosis not present

## 2016-05-06 DIAGNOSIS — E785 Hyperlipidemia, unspecified: Secondary | ICD-10-CM | POA: Diagnosis not present

## 2016-05-06 DIAGNOSIS — G609 Hereditary and idiopathic neuropathy, unspecified: Secondary | ICD-10-CM | POA: Diagnosis not present

## 2016-05-06 DIAGNOSIS — J449 Chronic obstructive pulmonary disease, unspecified: Secondary | ICD-10-CM | POA: Diagnosis not present

## 2016-05-13 DIAGNOSIS — M9904 Segmental and somatic dysfunction of sacral region: Secondary | ICD-10-CM | POA: Diagnosis not present

## 2016-05-13 DIAGNOSIS — M9903 Segmental and somatic dysfunction of lumbar region: Secondary | ICD-10-CM | POA: Diagnosis not present

## 2016-05-13 DIAGNOSIS — M5136 Other intervertebral disc degeneration, lumbar region: Secondary | ICD-10-CM | POA: Diagnosis not present

## 2016-05-13 DIAGNOSIS — M9905 Segmental and somatic dysfunction of pelvic region: Secondary | ICD-10-CM | POA: Diagnosis not present

## 2016-06-03 DIAGNOSIS — N200 Calculus of kidney: Secondary | ICD-10-CM | POA: Diagnosis not present

## 2016-06-26 DIAGNOSIS — H353132 Nonexudative age-related macular degeneration, bilateral, intermediate dry stage: Secondary | ICD-10-CM | POA: Diagnosis not present

## 2016-06-26 DIAGNOSIS — H524 Presbyopia: Secondary | ICD-10-CM | POA: Diagnosis not present

## 2016-06-26 DIAGNOSIS — H401133 Primary open-angle glaucoma, bilateral, severe stage: Secondary | ICD-10-CM | POA: Diagnosis not present

## 2016-06-30 ENCOUNTER — Ambulatory Visit (INDEPENDENT_AMBULATORY_CARE_PROVIDER_SITE_OTHER): Payer: Medicare Other | Admitting: Pulmonary Disease

## 2016-06-30 ENCOUNTER — Encounter: Payer: Self-pay | Admitting: Pulmonary Disease

## 2016-06-30 DIAGNOSIS — J479 Bronchiectasis, uncomplicated: Secondary | ICD-10-CM

## 2016-06-30 NOTE — Progress Notes (Signed)
Subjective:    Patient ID: Jocelyn Ramirez, female    DOB: 03/30/1929, 83 y.o.   MRN: [id number]  Synopsis: First came to the [facility] in 2016 for evaluation of idiopathic bronchiectasis. 2016 CT chest images personally reviewed showing emphysema and lower lobe bronchiectasis with scattered nodules. Overall apperance suggestive of MAI She was previously seen by a pulmonologist named Dr. Glade Lloyd in [facility].   HPI Chief Complaint  Patient presents with  . Follow-up    pt doing well, hoarseness is still present but not as bad as it was on Advair discus.     Jocelyn Ramirez has been doing OK.  She has not caught any bad colds this year fortunately.  She switched from Advair discuss to the [facility] inhaler and her hoarseness is better. She has been traveling lately and has not had any trouble.  Her hoarseness has improved significantly.  She has had some tooth and visual problems lately, but by her report these don't sound related to changing to the Advair HFA.  She denies cough but she will produce mucus when she coughs.   Past Medical History:  Diagnosis Date  . COPD (chronic obstructive pulmonary disease) ([place])   . Lung cancer ([place])   . Peripheral neuropathy (HCC)       Review of Systems  Constitutional: Negative for chills, fatigue and fever.  HENT: Negative for rhinorrhea, sinus pressure and sneezing.   Respiratory: Positive for cough and shortness of breath. Negative for wheezing.   Cardiovascular: Positive for leg swelling. Negative for chest pain and palpitations.       Objective:   Physical Exam  Vitals:   06/30/16 1357  BP: 128/66  Pulse: 86  SpO2: 92%  Weight: 150 lb (68 kg)  Height: '5\' 3"'$  (1.6 m)   RA  Gen: well appearing HENT: OP clear, TM's clear, neck supple PULM: CTA B, normal percussion CV: RRR, no mgr, trace edema GI: BS+, soft, nontender Derm: no cyanosis or rash Psyche: normal mood and affect        Assessment & Plan:  Bronchiectasis without  acute exacerbation (HCC) This has been a stable interval for [place].  She has not had an exacerbation since the last visit and she is tolerating Advair without difficulty.    Plan: Continue Advair HFA 2 puffs twice a day not matter how you feel. Stay active, exercise regularly. Follow up with [place] in 6 months or sooner if needed    Current Outpatient Prescriptions:  .  atorvastatin (LIPITOR) 10 MG tablet, Take 10 mg by mouth daily., Disp: , Rfl:  .  Brinzolamide-Brimonidine (SIMBRINZA) 1-0.2 % SUSP, Apply 1 drop to eye 3 (three) times daily., Disp: , Rfl:  .  carboxymethylcellulose (REFRESH PLUS) 0.5 % SOLN, 1 drop 3 (three) times daily as needed., Disp: , Rfl:  .  dabigatran (PRADAXA) 150 MG CAPS capsule, Take 150 mg by mouth 2 (two) times daily., Disp: , Rfl:  .  diltiazem (CARDIZEM CD) 120 MG 24 hr capsule, Take 1 capsule by mouth daily., Disp: , Rfl:  .  EXTRA STRENGTH ACETAMINOPHEN PO, Take 1 tablet by mouth as needed., Disp: , Rfl:  .  fluticasone-salmeterol (ADVAIR HFA) 115-21 MCG/ACT inhaler, Inhale 2 puffs into the lungs 2 (two) times daily., Disp: 3 Inhaler, Rfl: 3 .  gabapentin (NEURONTIN) 300 MG capsule, Take 2 capsules (600 mg total) by mouth 3 (three) times daily., Disp: 180 capsule, Rfl: 1 .  oxybutynin (DITROPAN) 5 MG tablet, Take 5 mg  by mouth daily., Disp: , Rfl:  .  Spacer/Aero-Holding Chambers (AEROCHAMBER MV) inhaler, Use as instructed, Disp: 1 each, Rfl: 0 .  timolol (TIMOPTIC) 0.25 % ophthalmic solution, Place 1 drop into both eyes 2 (two) times daily., Disp: , Rfl:

## 2016-06-30 NOTE — Patient Instructions (Signed)
Continue Advair HFA 2 puffs twice a day not matter how you feel. Stay active, exercise regularly. Follow up with [place] in 6 months or sooner if needed

## 2016-06-30 NOTE — Assessment & Plan Note (Signed)
This has been a stable interval for [place].  She has not had an exacerbation since the last visit and she is tolerating Advair without difficulty.    Plan: Continue Advair HFA 2 puffs twice a day not matter how you feel. Stay active, exercise regularly. Follow up with [place] in 6 months or sooner if needed

## 2016-08-05 DIAGNOSIS — I480 Paroxysmal atrial fibrillation: Secondary | ICD-10-CM | POA: Diagnosis not present

## 2016-08-05 DIAGNOSIS — N3281 Overactive bladder: Secondary | ICD-10-CM | POA: Diagnosis not present

## 2016-08-05 DIAGNOSIS — Z85118 Personal history of other malignant neoplasm of bronchus and lung: Secondary | ICD-10-CM | POA: Diagnosis not present

## 2016-08-05 DIAGNOSIS — Z7901 Long term (current) use of anticoagulants: Secondary | ICD-10-CM | POA: Diagnosis not present

## 2016-08-05 DIAGNOSIS — G609 Hereditary and idiopathic neuropathy, unspecified: Secondary | ICD-10-CM | POA: Diagnosis not present

## 2016-08-05 DIAGNOSIS — H5461 Unqualified visual loss, right eye, normal vision left eye: Secondary | ICD-10-CM | POA: Diagnosis not present

## 2016-08-05 DIAGNOSIS — J449 Chronic obstructive pulmonary disease, unspecified: Secondary | ICD-10-CM | POA: Diagnosis not present

## 2016-08-05 DIAGNOSIS — J479 Bronchiectasis, uncomplicated: Secondary | ICD-10-CM | POA: Diagnosis not present

## 2016-08-05 DIAGNOSIS — E785 Hyperlipidemia, unspecified: Secondary | ICD-10-CM | POA: Diagnosis not present

## 2016-08-27 ENCOUNTER — Telehealth: Payer: Self-pay | Admitting: Pulmonary Disease

## 2016-08-27 NOTE — Telephone Encounter (Signed)
Pt recently established with a new PCP who is urging for pt to have a PFT  Pt states that if she is to have a PFT she would prefer to have it through [facility].  Pt is requesting to know if [facility] thinks a PFT is necessary.  If so, would he be willing to order it for her. Pt aware that [facility] is out of the office for the rest of the week, will receive a call back next week regarding this matter.  BQ please advise.  Thanks!

## 2016-09-01 NOTE — Telephone Encounter (Signed)
I don't think it's necessary for her to have a PFT. Please let her know that her PCP can reach out to me to discuss further.

## 2016-09-01 NOTE — Telephone Encounter (Signed)
Pt aware of below message and voiced her understanding. Nothing further needed.

## 2016-09-03 DIAGNOSIS — E785 Hyperlipidemia, unspecified: Secondary | ICD-10-CM | POA: Diagnosis not present

## 2016-09-03 DIAGNOSIS — J439 Emphysema, unspecified: Secondary | ICD-10-CM | POA: Diagnosis not present

## 2016-09-03 DIAGNOSIS — I481 Persistent atrial fibrillation: Secondary | ICD-10-CM | POA: Diagnosis not present

## 2016-09-03 DIAGNOSIS — Z6825 Body mass index (BMI) 25.0-25.9, adult: Secondary | ICD-10-CM | POA: Diagnosis not present

## 2016-09-03 DIAGNOSIS — C3490 Malignant neoplasm of unspecified part of unspecified bronchus or lung: Secondary | ICD-10-CM | POA: Diagnosis not present

## 2016-11-20 DIAGNOSIS — H353132 Nonexudative age-related macular degeneration, bilateral, intermediate dry stage: Secondary | ICD-10-CM | POA: Diagnosis not present

## 2016-11-20 DIAGNOSIS — H524 Presbyopia: Secondary | ICD-10-CM | POA: Diagnosis not present

## 2016-11-20 DIAGNOSIS — H401133 Primary open-angle glaucoma, bilateral, severe stage: Secondary | ICD-10-CM | POA: Diagnosis not present

## 2016-11-28 ENCOUNTER — Telehealth: Payer: Self-pay | Admitting: Pulmonary Disease

## 2016-11-28 NOTE — Telephone Encounter (Signed)
Pt's pharmacy has been updated in her chart. She is aware. Nothing further was needed.

## 2016-12-09 DIAGNOSIS — N200 Calculus of kidney: Secondary | ICD-10-CM | POA: Diagnosis not present

## 2016-12-25 DIAGNOSIS — H524 Presbyopia: Secondary | ICD-10-CM | POA: Diagnosis not present

## 2016-12-25 DIAGNOSIS — H353132 Nonexudative age-related macular degeneration, bilateral, intermediate dry stage: Secondary | ICD-10-CM | POA: Diagnosis not present

## 2016-12-25 DIAGNOSIS — H401133 Primary open-angle glaucoma, bilateral, severe stage: Secondary | ICD-10-CM | POA: Diagnosis not present

## 2017-01-05 ENCOUNTER — Ambulatory Visit (INDEPENDENT_AMBULATORY_CARE_PROVIDER_SITE_OTHER): Payer: Medicare Other | Admitting: Pulmonary Disease

## 2017-01-05 ENCOUNTER — Encounter: Payer: Self-pay | Admitting: Pulmonary Disease

## 2017-01-05 VITALS — BP 128/84 | HR 74 | Ht 63.0 in | Wt 142.6 lb

## 2017-01-05 DIAGNOSIS — Z23 Encounter for immunization: Secondary | ICD-10-CM

## 2017-01-05 DIAGNOSIS — J479 Bronchiectasis, uncomplicated: Secondary | ICD-10-CM | POA: Diagnosis not present

## 2017-01-05 DIAGNOSIS — R490 Dysphonia: Secondary | ICD-10-CM

## 2017-01-05 NOTE — Patient Instructions (Addendum)
For your bronchiectasis: Keep taking Advair as you are doing Remain active Get a flu shot  For your hoarseness: Rest your voice as much as possible and rinse your mouth after using your inhaler

## 2017-01-05 NOTE — Progress Notes (Signed)
Subjective:    Patient ID: Jocelyn Ramirez, female    DOB: [birth year], 83 y.o.   MRN: [id number]  Synopsis: First came to the [facility] in 2016 for evaluation of idiopathic bronchiectasis. S/p RUL resectino She was previously seen by a pulmonologist named Dr. Glade Lloyd in [facility]   HPI Chief Complaint  Patient presents with  . Follow-up    Pt well overall, advair bothers pt's throat and voice.   Jocelyn Ramirez is beaming because she is a new great grandmother. She says that the twins are doing well.    Her breathing is really doing OK.  She says that she has not had an exacerbation since the last visit.  She doesn't climb mountains or hills anymore so she says that she really doesn't "challeng" herself much.  She is mostly limited by neuropathy.  She remains very active.    She doesn't cough up much mucus.    She has not had any exacerbations of her bronchiectasis.   She continues to struggle with hoarseness.  She doesn't have allergic rhinitis symptoms or GERD symptoms.    Past Medical History:  Diagnosis Date  . COPD (chronic obstructive pulmonary disease) ([place])   . Lung cancer ([place])   . Peripheral neuropathy       Review of Systems  Constitutional: Negative for chills, fatigue and fever.  HENT: Negative for rhinorrhea, sinus pressure and sneezing.   Respiratory: Positive for cough and shortness of breath. Negative for wheezing.   Cardiovascular: Positive for leg swelling. Negative for chest pain and palpitations.       Objective:   Physical Exam  Vitals:   01/05/17 1521  BP: 128/84  Pulse: 74  SpO2: 90%  Weight: 142 lb 9.6 oz (64.7 kg)  Height: 5\' 3"  (1.6 m)   RA  Gen: well appearing HENT: OP clear, TM's clear, neck supple PULM: Slight wheeze B, normal percussion CV: RRR, no mgr, trace edema GI: BS+, soft, nontender Derm: no cyanosis or rash Psyche: normal mood and affect   Chest imaging: 2016 CT chest images personally reviewed showing emphysema  and lower lobe bronchiectasis with scattered nodules. Overall apperance suggestive of MAI     Assessment & Plan:  Bronchiectasis without complication ([place])  Hoarseness  Discussion: [place] has been stable.  SheHas not had an exacerbation since the last visit and overall she's doing well. She does continue to struggle with hoarseness which I believe is in part related to her medications but given the lack of progression in this and her lack of other symptoms (acid reflux or postnasal drip) I don't think there is a role for having ENT take a look at this time.  Plan: For your bronchiectasis: Keep taking Advair as you are doing Remain active Get a flu shot today  For your hoarseness: Rest your voice as much as possible and rinse your mouth after using your inhaler    Current Outpatient Prescriptions:  .  atorvastatin (LIPITOR) 10 MG tablet, Take 10 mg by mouth daily., Disp: , Rfl:  .  bimatoprost (LUMIGAN) 0.03 % ophthalmic solution, 1 drop at bedtime., Disp: , Rfl:  .  carboxymethylcellulose (REFRESH PLUS) 0.5 % SOLN, 1 drop 3 (three) times daily as needed., Disp: , Rfl:  .  dabigatran (PRADAXA) 150 MG CAPS capsule, Take 150 mg by mouth 2 (two) times daily., Disp: , Rfl:  .  diltiazem (CARDIZEM CD) 120 MG 24 hr capsule, Take 1 capsule by mouth daily., Disp: , Rfl:  .  EXTRA STRENGTH ACETAMINOPHEN PO, Take 1 tablet by mouth as needed., Disp: , Rfl:  .  fluticasone-salmeterol (ADVAIR HFA) 115-21 MCG/ACT inhaler, Inhale 2 puffs into the lungs 2 (two) times daily., Disp: 3 Inhaler, Rfl: 3 .  gabapentin (NEURONTIN) 300 MG capsule, Take 2 capsules (600 mg total) by mouth 3 (three) times daily., Disp: 180 capsule, Rfl: 1 .  oxybutynin (DITROPAN) 5 MG tablet, Take 5 mg by mouth daily., Disp: , Rfl:  .  Spacer/Aero-Holding Chambers (AEROCHAMBER MV) inhaler, Use as instructed, Disp: 1 each, Rfl: 0 .  timolol (TIMOPTIC) 0.25 % ophthalmic solution, Place 1 drop into both eyes 2 (two) times daily.,  Disp: , Rfl:  .  Brinzolamide-Brimonidine (SIMBRINZA) 1-0.2 % SUSP, Apply 1 drop to eye 3 (three) times daily., Disp: , Rfl:

## 2017-01-07 DIAGNOSIS — H8113 Benign paroxysmal vertigo, bilateral: Secondary | ICD-10-CM | POA: Diagnosis not present

## 2017-01-27 DIAGNOSIS — M9903 Segmental and somatic dysfunction of lumbar region: Secondary | ICD-10-CM | POA: Diagnosis not present

## 2017-01-27 DIAGNOSIS — M5136 Other intervertebral disc degeneration, lumbar region: Secondary | ICD-10-CM | POA: Diagnosis not present

## 2017-01-27 DIAGNOSIS — M5031 Other cervical disc degeneration,  high cervical region: Secondary | ICD-10-CM | POA: Diagnosis not present

## 2017-01-27 DIAGNOSIS — M9901 Segmental and somatic dysfunction of cervical region: Secondary | ICD-10-CM | POA: Diagnosis not present

## 2017-02-02 DIAGNOSIS — M9903 Segmental and somatic dysfunction of lumbar region: Secondary | ICD-10-CM | POA: Diagnosis not present

## 2017-02-02 DIAGNOSIS — M9901 Segmental and somatic dysfunction of cervical region: Secondary | ICD-10-CM | POA: Diagnosis not present

## 2017-02-02 DIAGNOSIS — M5136 Other intervertebral disc degeneration, lumbar region: Secondary | ICD-10-CM | POA: Diagnosis not present

## 2017-02-02 DIAGNOSIS — M5031 Other cervical disc degeneration,  high cervical region: Secondary | ICD-10-CM | POA: Diagnosis not present

## 2017-02-05 ENCOUNTER — Ambulatory Visit
Admission: RE | Admit: 2017-02-05 | Discharge: 2017-02-05 | Disposition: A | Discharge status: Home / Self Care | Payer: Medicare Other | Source: Ambulatory Visit | Attending: Internal Medicine | Admitting: Internal Medicine

## 2017-02-05 ENCOUNTER — Other Ambulatory Visit: Payer: Self-pay | Admitting: Internal Medicine

## 2017-02-05 DIAGNOSIS — Z85118 Personal history of other malignant neoplasm of bronchus and lung: Secondary | ICD-10-CM | POA: Diagnosis not present

## 2017-02-05 DIAGNOSIS — J449 Chronic obstructive pulmonary disease, unspecified: Secondary | ICD-10-CM | POA: Diagnosis not present

## 2017-02-05 DIAGNOSIS — G609 Hereditary and idiopathic neuropathy, unspecified: Secondary | ICD-10-CM | POA: Diagnosis not present

## 2017-02-05 DIAGNOSIS — R490 Dysphonia: Secondary | ICD-10-CM | POA: Diagnosis not present

## 2017-02-05 DIAGNOSIS — L720 Epidermal cyst: Secondary | ICD-10-CM

## 2017-02-05 DIAGNOSIS — I480 Paroxysmal atrial fibrillation: Secondary | ICD-10-CM | POA: Diagnosis not present

## 2017-02-05 DIAGNOSIS — M19041 Primary osteoarthritis, right hand: Secondary | ICD-10-CM | POA: Diagnosis not present

## 2017-02-05 DIAGNOSIS — N3281 Overactive bladder: Secondary | ICD-10-CM | POA: Diagnosis not present

## 2017-02-05 DIAGNOSIS — Z1389 Encounter for screening for other disorder: Secondary | ICD-10-CM | POA: Diagnosis not present

## 2017-02-05 DIAGNOSIS — E785 Hyperlipidemia, unspecified: Secondary | ICD-10-CM | POA: Diagnosis not present

## 2017-02-05 DIAGNOSIS — Z0000 Encounter for general adult medical examination without abnormal findings: Secondary | ICD-10-CM | POA: Diagnosis not present

## 2017-02-11 DIAGNOSIS — J383 Other diseases of vocal cords: Secondary | ICD-10-CM | POA: Diagnosis not present

## 2017-02-11 DIAGNOSIS — R682 Dry mouth, unspecified: Secondary | ICD-10-CM | POA: Diagnosis not present

## 2017-02-11 DIAGNOSIS — G609 Hereditary and idiopathic neuropathy, unspecified: Secondary | ICD-10-CM | POA: Diagnosis not present

## 2017-02-11 DIAGNOSIS — Z87891 Personal history of nicotine dependence: Secondary | ICD-10-CM | POA: Diagnosis not present

## 2017-02-11 DIAGNOSIS — Z7289 Other problems related to lifestyle: Secondary | ICD-10-CM | POA: Diagnosis not present

## 2017-02-11 DIAGNOSIS — R42 Dizziness and giddiness: Secondary | ICD-10-CM | POA: Diagnosis not present

## 2017-02-20 DIAGNOSIS — R2231 Localized swelling, mass and lump, right upper limb: Secondary | ICD-10-CM | POA: Diagnosis not present

## 2017-02-23 ENCOUNTER — Other Ambulatory Visit: Payer: Self-pay | Admitting: Orthopedic Surgery

## 2017-02-24 ENCOUNTER — Ambulatory Visit: Payer: Medicare Other | Attending: Otolaryngology | Admitting: Speech Pathology

## 2017-02-24 DIAGNOSIS — R498 Other voice and resonance disorders: Secondary | ICD-10-CM | POA: Insufficient documentation

## 2017-02-24 NOTE — Therapy (Signed)
[facility] [address] [place], [place], [address] Phone: [phone]   Fax:  [phone]  Speech Language Pathology Evaluation  Patient Details  Name: Jocelyn Ramirez MRN: [id number] Date of Birth: [birth year] Referring Provider: Dr. Gavin Pound  Encounter Date: 02/24/2017      End of Session - 02/24/17 1711    Visit Number 1   Number of Visits 17   Date for SLP Re-Evaluation 04/24/17   SLP Start Time 4709   SLP Stop Time  6283   SLP Time Calculation (min) 22 min   Activity Tolerance Patient tolerated treatment well      Past Medical History:  Diagnosis Date  . COPD (chronic obstructive pulmonary disease) ([place])   . Lung cancer ([place])   . Peripheral neuropathy     Past Surgical History:  Procedure Laterality Date  . endometrial tumor    . perforated abcess on colon  1992  . RLL lobectomy  11/1992  . VAGINAL HYSTERECTOMY      There were no vitals filed for this visit.          SLP Evaluation [facility] - 02/24/17 1301      SLP Visit Information   SLP Received On 02/24/17   Referring Provider Dr. Gavin Pound   Onset Date 18 months ago per pt report   Medical Diagnosis vocal fold atrophy with glottic gap     Subjective   Subjective "I am very active and my voice is interferring with this"   Patient/Family Stated Goal "To get a stronger voice"     General Information   HPI Jocelyn Ramirez is a 83 y.o. female seen by ENT for evaluation of dysphonia. This began approximately 18 months ago. She is working with a Technical brewer and thought this may be due to use of Advair diskus. Changing inhalers did not improve her symptoms. Vocal projection is good. Never completely loses voice. Mostly concerned with harsh quality of her voice. Aggravating factors: not talking. Alleviated with: none. No vocal breaks. Sometimes voice becomes higher than she anticipates, sometimes "squeaks." Denies neck  masses, otalgia, dysphagia. No prior voice surgery. 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 She uses her voice often for these activities. Jocelyn. Shiley has h/o hoarse voice for about 18 months. ENT: Findings: The nasal cavity and nasopharynx are unremarkable. There are no suspicious findings in the nasopharynx or Fossa of [place]. The tongue base, pharyngeal walls, piriform sinuses, vallecula, epiglottis and postcricoid region are normal in appearance. The visualized portion of the subglottis and proximal trachea is widely patent. The vocal folds are mobile bilaterally. There are no lesions or significant inflammation. There is no glottal insufficiency. There is no pooling of secretions or aspiration. The vocal folds are thin. There is persistent glottic gap, associated with vocal fold atrophy   Behavioral/Cognition Denies difficulty   Mobility Status walks with cane, denies falls. reports she is persuing PT     Prior Functional Status   Cognitive/Linguistic Baseline Within functional limits   Type of Home Apartment    Lives With Alone   Available Support Friend(s);Family   IT trainer work     Charity fundraiser Status Within [employer] for tasks assessed     Oral Motor/Sensory Function   Overall Oral Motor/Sensory Function Appears within functional limits for tasks assessed     Motor Speech   Overall Motor Speech Impaired  Respiration Impaired   Level of Impairment Conversation   Phonation Hoarse   Resonance Within functional limits   Articulation Within functional limitis   Intelligibility Intelligibility reduced   Conversation 75-100% accurate   Effective Techniques Increased vocal intensity   Phonation --   Tension Present Neck;Shoulder   Volume Appropriate   Pitch High                      ADULT SLP TREATMENT - 02/24/17 1301      General Information   Behavior/Cognition  Alert;Cooperative;Pleasant mood             SLP Short Term Goals - 02/24/17 1753      SLP SHORT TERM GOAL #1   Title Pt will perfrom HEP for vocal fold adduction with occasional min A over 2 sessions   Time 4   Period Weeks   Status New     SLP SHORT TERM GOAL #2   Title Pt will demonstrate abdominal breathing in structured speech tasks 18/20 responses with occasional min A   Time 4   Period Weeks   Status New     SLP SHORT TERM GOAL #3   Title Pt will report 25% reduction in dysphonia, subjectively, over 2 sessions   Time 4   Period Weeks   Status New          SLP Long Term Goals - 02/24/17 1755      SLP LONG TERM GOAL #1   Title Pt will perform HEP for dysphonia with rare min A over 3 sessions   Time 8   Period Weeks   Status New     SLP LONG TERM GOAL #2   Title Pt will demonstrate abdominal breathing and adequate volume in simple conversation over 8 minutes with rare min A   Time 8   Period Weeks   Status New     SLP LONG TERM GOAL #3   Title Pt will achieve WNL phonation over 15 minute conversation with rare min A   Time 8   Period Weeks   Status New     SLP LONG TERM GOAL #5   Title Pt will report 50% reduction in dysphonia subjectively, over 3 sessions   Time 8   Period Weeks   Status New          Plan - 02/24/17 1712    Clinical Impression Statement Jocelyn Ramirez, an 83 y.o. 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 The patient arrived 25 minutes late for this evaluation, as she got lost, so futher measurments may be warranted.  Jocelyn Ramirez reports she is very active in volunteer work and has difficulty communicating over the phone, in large groups and noisy environments. She scored a 34 on the Voice Handicap Index, indicating she rates her dysphonia as moderate impairment. Jocelyn Ramirez's voice was noted to have randomm pitch elevations with hoarse/harsh voice which is distracting to the  listener. She reports her voice is worse in the am or if she hasn't spoken for a day or two (pt lives alone). She denies loss of voice or pitch breaks. Reduced breath support with tension in her neck is observed. In a quiet room, Jocelyn Ramirez is 100% intelligible. Due to reduced time/late arrival, I initiated training in 4 vocal fold exercises for HEP. Increased effort and volume did improve quality of her voice.  I recommend skilled ST  to maximize phonation and volume for improved intellgibility in noisy environments and phone communications.    Speech Therapy Frequency 2x / week   Duration --  8 weeks   Treatment/Interventions Cueing hierarchy;Multimodal communcation approach;Functional tasks;Patient/family education;Compensatory techniques;Environmental controls;Internal/external aids;SLP instruction and feedback   Potential to Achieve Goals Fair   Potential Considerations Severity of impairments   SLP Home Exercise Plan HEP for vocal fold adduction - see pt instructions   Consulted and Agree with Plan of Care Patient      Patient will benefit from skilled therapeutic intervention in order to improve the following deficits and impairments:   Other voice and resonance disorders - Plan: SLP plan of care cert/re-cert      G-Codes - 70/48/88 1518    Functional Assessment Tool Used NOMS   Functional Limitations Voice   Voice Current Status (G9171) At least 20 percent but less than 40 percent impaired, limited or restricted   Voice Goal Status (G9172) At least 1 percent but less than 20 percent impaired, limited or restricted      Problem List Patient Active Problem List   Diagnosis Date Noted  . Allergic rhinitis 01/01/2016  . Bronchiectasis without acute exacerbation ([place]) 11/03/2014  . COPD with emphysema ([place]) 11/03/2014  . Lung cancer ([place]) 11/03/2014  . Peripheral neuralgia 11/03/2014    Sinia Antosh, Annye Rusk Jocelyn, CCC-SLP 02/24/2017, 5:59 PM  [facility] [address] [place], [place], [address] Phone: [phone]   Fax:  [phone]  Name: Eleshia Wooley MRN: [id number] Date of Birth: 25-Nov-1928

## 2017-02-24 NOTE — Patient Instructions (Signed)
  VOCAL FOLD ADDUCTION EXERCISES  Breathe before each excercise  Yawn-Sigh  Take an easy breath through your mouth while yawning gently. When you breathe out, say an easy, prolonged "AH" 10x, 3x a day  1.  PUSH Bear down against a chair while saying /a/ 5 times, with a lot of effort  2.  PULL up of the seat of a chair with both hands while saying /a/ 5 times with a lot of   Effort  3. Take a breath, hold it for 3 seconds with your mouth open, then release it with a loud exhale. Think of an army or football "HUH." You are using your voice box to hold in your breath.  4. Glide your pitch high and low (as low and high as possible) "Knoll"  5. Glide your pitch low to high "Knoll"  6. Gentle cough/throat clear, then continue voicing for 3-5 seconds  7. Hold your breath, swallow hard, then immediately cough  8. Gargle with your voice on   Do these 10 times each, 3 times a day  Provided by: Barnabas Lister [place], [phone]

## 2017-03-02 ENCOUNTER — Encounter: Payer: Self-pay | Admitting: Speech Pathology

## 2017-03-02 ENCOUNTER — Ambulatory Visit: Payer: Medicare Other | Attending: Otolaryngology | Admitting: Speech Pathology

## 2017-03-02 DIAGNOSIS — R498 Other voice and resonance disorders: Secondary | ICD-10-CM | POA: Insufficient documentation

## 2017-03-02 NOTE — Therapy (Signed)
[facility] [address] [place], [place], [address] Phone: [phone]   Fax:  [phone]  Speech Language Pathology Treatment  Patient Details  Name: Jocelyn Ramirez MRN: [id number] Date of Birth: Sep 19, 1928 Referring Provider: Dr. Gavin Pound   Encounter Date: 03/02/2017  End of Session - 03/02/17 1416    Visit Number  2    Number of Visits  17    Date for SLP Re-Evaluation  04/24/17    SLP Start Time  0263    SLP Stop Time   1400    SLP Time Calculation (min)  42 min    Activity Tolerance  Patient tolerated treatment well       Past Medical History:  Diagnosis Date  . COPD (chronic obstructive pulmonary disease) ([place])   . Lung cancer ([place])   . Peripheral neuropathy     Past Surgical History:  Procedure Laterality Date  . endometrial tumor    . perforated abcess on colon  1992  . RLL lobectomy  11/1992  . VAGINAL HYSTERECTOMY      There were no vitals filed for this visit.  Subjective Assessment - 03/02/17 1326    Subjective  "It's hard to carve out the time to do this 3x a day"    Currently in Pain?  No/denies            ADULT SLP TREATMENT - 03/02/17 1330      General Information   Behavior/Cognition  Alert;Cooperative;Pleasant mood      Treatment Provided   Treatment provided  Cognitive-Linquistic      Pain Assessment   Pain Assessment  No/denies pain      Cognitive-Linquistic Treatment   Treatment focused on  Voice    Skilled Treatment  Continued training in HEP for vocal fold adduction - pt required usual mod A for laryngeal breath hold "huh" and supraglottic swallow. Push-pull phonation and pitch glides with min A.  Initiated training in abdominal breathing with phonation and increased volume with usual mod A.      Assessment / Recommendations / Plan   Plan  Continue with current plan of care      Progression Toward Goals   Progression toward goals  Progressing toward  goals       SLP Education - 03/02/17 1413    Education provided  Yes    Education Details  abdmonimal breathing to give voice "power" HEP for vocal fold adduction    Person(s) Educated  Patient    Methods  Explanation;Demonstration;Tactile cues;Verbal cues;Handout    Comprehension  Verbalized understanding;Returned demonstration;Verbal cues required;Need further instruction       SLP Short Term Goals - 03/02/17 1415      SLP SHORT TERM GOAL #1   Title  Pt will perfrom HEP for vocal fold adduction with occasional min A over 2 sessions    Time  4    Period  Weeks    Status  On-going      SLP SHORT TERM GOAL #2   Title  Pt will demonstrate abdominal breathing in structured speech tasks 18/20 responses with occasional min A    Time  4    Period  Weeks    Status  On-going      SLP SHORT TERM GOAL #3   Title  Pt will report 25% reduction in dysphonia, subjectively, over 2 sessions    Time  4    Period  Weeks    Status  On-going       SLP Long Term Goals - 03/02/17 1416      SLP LONG TERM GOAL #1   Title  Pt will perform HEP for dysphonia with rare min A over 3 sessions    Time  8    Period  Weeks    Status  On-going      SLP LONG TERM GOAL #2   Title  Pt will demonstrate abdominal breathing and adequate volume in simple conversation over 8 minutes with rare min A    Time  8    Period  Weeks    Status  On-going      SLP LONG TERM GOAL #3   Title  Pt will achieve WNL phonation over 15 minute conversation with rare min A    Time  8    Period  Weeks    Status  On-going      SLP LONG TERM GOAL #5   Title  Pt will report 50% reduction in dysphonia subjectively, over 3 sessions    Time  [place] - 03/02/17 1414    Clinical Impression Statement  Mrs. Toy Cookey required mod A for laryngeal breath hold and supraglottic swallow for vocal fold adduction. Mod A for abdominal breathing to give her voice power. Continue skilled ST to  maximize intellgibility in community activities.     Speech Therapy Frequency  2x / week    Treatment/Interventions  Cueing hierarchy;Multimodal communcation approach;Functional tasks;Patient/family education;Compensatory techniques;Environmental controls;Internal/external aids;SLP instruction and feedback    Potential to Achieve Goals  Fair    Potential Considerations  Severity of impairments    SLP Home Exercise Plan  HEP for vocal fold adduction - see pt instructions    Consulted and Agree with Plan of Care  Patient       Patient will benefit from skilled therapeutic intervention in order to improve the following deficits and impairments:   Other voice and resonance disorders    Problem List Patient Active Problem List   Diagnosis Date Noted  . Allergic rhinitis 01/01/2016  . Bronchiectasis without acute exacerbation ([place]) 11/03/2014  . COPD with emphysema ([place]) 11/03/2014  . Lung cancer ([place]) 11/03/2014  . Peripheral neuralgia 11/03/2014    Lovvorn, Annye Rusk MS, CCC-SLP 03/02/2017, 2:17 PM  [facility] [address] [place], [place], [address] Phone: [phone]   Fax:  [phone]   Name: Jocelyn Ramirez MRN: [id number] Date of Birth: [birth year]

## 2017-03-02 NOTE — Patient Instructions (Signed)
    ABDOMINAL BREATHING    . Shoulders down - this is a cue to relax . Place your hand on your abdomen - this helps you focus on easy abdominal breath support - the best and most relaxed way to breathe . Breathe in  and fill your belly with air, watching your hand move outward . Breathe out  and watch your belly move in.    Think of your belly as a balloon, when you fill with air (inhale), the balloon gets bigger. As the air goes out (exhale), the balloon deflates.  If you are having difficulty coordinating this, lay on your back with a plastic cup on your belly and repeat the above steps, watching you belly move up with inhalation and down with exhalations  Practice breathing in and out in front of a mirror, watching your belly Breathe in for a count of 5 and breathe out for a count of 5  Shoulders and chest relax and should be relatively still  - watch in mirror             Provided by: Georgiann Hahn. SLP [phone]

## 2017-03-06 ENCOUNTER — Ambulatory Visit: Payer: Medicare Other

## 2017-03-06 DIAGNOSIS — R498 Other voice and resonance disorders: Secondary | ICD-10-CM | POA: Diagnosis not present

## 2017-03-06 NOTE — Therapy (Signed)
[facility] [address] [place], [place], [address] Phone: [phone]   Fax:  [phone]  Speech Language Pathology Treatment  Patient Details  Name: Jocelyn Ramirez MRN: [id number] Date of Birth: Sep 01, 1928 Referring Provider: Dr. Gavin Pound   Encounter Date: 03/06/2017  End of Session - 03/06/17 1506    Visit Number  3    Number of Visits  17    Date for SLP Re-Evaluation  04/24/17    SLP Start Time  8588    SLP Stop Time   1450    SLP Time Calculation (min)  45 min    Activity Tolerance  Patient tolerated treatment well       Past Medical History:  Diagnosis Date  . COPD (chronic obstructive pulmonary disease) ([place])   . Lung cancer ([place])   . Peripheral neuropathy     Past Surgical History:  Procedure Laterality Date  . endometrial tumor    . perforated abcess on colon  1992  . RLL lobectomy  11/1992  . VAGINAL HYSTERECTOMY      There were no vitals filed for this visit.  Subjective Assessment - 03/06/17 1410    Subjective  "At least now when I wake in the morning I have a voice!"    Currently in Pain?  No/denies            ADULT SLP TREATMENT - 03/06/17 1452      General Information   Behavior/Cognition  Alert;Cooperative;Pleasant mood      Treatment Provided   Treatment provided  Cognitive-Linquistic      Cognitive-Linquistic Treatment   Treatment focused on  Voice    Skilled Treatment  Continued training in HEP for vocal fold adduction - pt required occasional min A for laryngeal breath hold "huh",  and mod A usually with supraglottic swallow. Push-pull phonation and pitch glides independent. Abdominal breathing (AB) at rest completed with initial mod-max cues consistently faded to min cues occasionally after 15 minutes practice. Adding simple sentences made pt revert back to chst/clavicular breathing consistently so SLP abandoned attempt to combine senteneces with AB. SLP  changed focus to stronger voice and pt was approx 80% successful with WNL vocal quality. Pt encouraged to think about "talking strong" throughout the weekend.      Assessment / Recommendations / Plan   Plan  Continue with current plan of care      Progression Toward Goals   Progression toward goals  Progressing toward goals       SLP Education - 03/06/17 1506    Education provided  Yes    Education Details  abdominal breathing, "strong" voice, HEP    Person(s) Educated  Patient    Methods  Explanation;Demonstration;Verbal cues    Comprehension  Verbalized understanding;Returned demonstration;Verbal cues required       SLP Short Term Goals - 03/06/17 1508      SLP SHORT TERM GOAL #1   Title  Pt will perfrom HEP for vocal fold adduction with occasional min A over 2 sessions    Time  4    Period  Weeks    Status  On-going      SLP SHORT TERM GOAL #2   Title  Pt will demonstrate abdominal breathing in structured speech tasks 18/20 responses with occasional min A    Time  4    Period  Weeks    Status  On-going      SLP SHORT TERM GOAL #3  Title  Pt will report 25% reduction in dysphonia, subjectively, over 2 sessions    Time  4    Period  Weeks    Status  On-going       SLP Long Term Goals - 03/06/17 1508      SLP LONG TERM GOAL #1   Title  Pt will perform HEP for dysphonia with rare min A over 3 sessions    Time  8    Period  Weeks    Status  On-going      SLP LONG TERM GOAL #2   Title  Pt will demonstrate abdominal breathing and adequate volume in simple conversation over 8 minutes with rare min A    Time  8    Period  Weeks    Status  On-going      SLP LONG TERM GOAL #3   Title  Pt will achieve WNL phonation over 15 minute conversation with rare min A    Time  8    Period  Weeks    Status  On-going      SLP LONG TERM GOAL #5   Title  Pt will report 50% reduction in dysphonia subjectively, over 3 sessions    Time  8    Period  Weeks    Status  New        Plan - 03/06/17 1507    Clinical Impression Statement  Mrs. Toy Cookey required consistent SLP A at various levels for all tasks today, however her level of cueing decr'd over time for each task. Better quality/WNL voicing was heard with reading everyday sentences. Continue skilled ST to maximize intellgibility in community activities.     Speech Therapy Frequency  2x / week    Treatment/Interventions  Cueing hierarchy;Multimodal communcation approach;Functional tasks;Patient/family education;Compensatory techniques;Environmental controls;Internal/external aids;SLP instruction and feedback    Potential to Achieve Goals  Fair    Potential Considerations  Severity of impairments    SLP Home Exercise Plan  HEP for vocal fold adduction - see pt instructions    Consulted and Agree with Plan of Care  Patient       Patient will benefit from skilled therapeutic intervention in order to improve the following deficits and impairments:   Other voice and resonance disorders    Problem List Patient Active Problem List   Diagnosis Date Noted  . Allergic rhinitis 01/01/2016  . Bronchiectasis without acute exacerbation ([place]) 11/03/2014  . COPD with emphysema ([place]) 11/03/2014  . Lung cancer ([place]) 11/03/2014  . Peripheral neuralgia 11/03/2014    [facility] ,[place], CCC-SLP  03/06/2017, 3:08 PM  [facility] [address] [place], [place], [address] Phone: [phone]   Fax:  [phone]   Name: Jocelyn Ramirez MRN: [id number] Date of Birth: 02-Jul-1928

## 2017-03-06 NOTE — Patient Instructions (Signed)
Continue to practice your breathing, strong voice, and your voice exercises over the weekend.

## 2017-03-09 DIAGNOSIS — D2111 Benign neoplasm of connective and other soft tissue of right upper limb, including shoulder: Secondary | ICD-10-CM | POA: Diagnosis not present

## 2017-03-09 DIAGNOSIS — D2361 Other benign neoplasm of skin of right upper limb, including shoulder: Secondary | ICD-10-CM | POA: Diagnosis not present

## 2017-03-09 DIAGNOSIS — D1721 Benign lipomatous neoplasm of skin and subcutaneous tissue of right arm: Secondary | ICD-10-CM | POA: Diagnosis not present

## 2017-03-13 ENCOUNTER — Ambulatory Visit: Payer: Medicare Other

## 2017-03-13 DIAGNOSIS — R498 Other voice and resonance disorders: Secondary | ICD-10-CM

## 2017-03-13 NOTE — Therapy (Signed)
[facility] [address] [place], [place], [address] Phone: [phone]   Fax:  [phone]  Speech Language Pathology Treatment  Patient Details  Name: Jocelyn Ramirez MRN: [id number] Date of Birth: [birth year] Referring Provider: Dr. Gavin Pound   Encounter Date: 03/13/2017  End of Session - 03/13/17 1456    Visit Number  4    Number of Visits  17    Date for SLP Re-Evaluation  04/24/17    SLP Start Time  1407 5 minutes late    SLP Stop Time   1450    SLP Time Calculation (min)  43 min    Activity Tolerance  Patient tolerated treatment well       Past Medical History:  Diagnosis Date  . COPD (chronic obstructive pulmonary disease) ([place])   . Lung cancer ([place])   . Peripheral neuropathy     Past Surgical History:  Procedure Laterality Date  . endometrial tumor    . perforated abcess on colon  1992  . RLL lobectomy  11/1992  . VAGINAL HYSTERECTOMY      There were no vitals filed for this visit.  Subjective Assessment - 03/13/17 1413    Subjective  "Well this (cane) lends me a lot more confidence."    Currently in Pain?  No/denies            ADULT SLP TREATMENT - 03/13/17 1413      General Information   Behavior/Cognition  Alert;Cooperative;Pleasant mood      Treatment Provided   Treatment provided  Cognitive-Linquistic      Cognitive-Linquistic Treatment   Treatment focused on  Voice    Skilled Treatment  Continued training in HEP for vocal fold adduction - pt required occasional min-mod A for Low to high and high to low "knoll". and min A usually with supraglottic swallow and "huh". Push-pull phonation and pitch glides independent. Abdominal breathing (AB) at rest completed with initial mod-max cues consistently faded to min cues occasionally after 15 minutes practice. Adding simple sentences made pt revert back to chst/clavicular breathing consistently so SLP abandoned attempt to combine  senteneces with AB. SLP changed focus to stronger voice and pt was approx 80% successful with WNL vocal quality. Pt encouraged to think about "talking strong" throughout the weekend.      Assessment / Recommendations / Plan   Plan  Continue with current plan of care      Progression Toward Goals   Progression toward goals  Progressing toward goals       SLP Education - 03/13/17 1456    Education provided  Yes    Education Details  remember abdominal strength when speaking    Person(s) Educated  Patient    Methods  Explanation;Demonstration;Verbal cues    Comprehension  Verbal cues required;Verbalized understanding;Returned demonstration;Need further instruction       SLP Short Term Goals - 03/13/17 1651      SLP SHORT TERM GOAL #1   Title  Pt will perfrom HEP for vocal fold adduction with occasional min A over 2 sessions    Time  3    Period  Weeks    Status  On-going      SLP SHORT TERM GOAL #2   Title  Pt will demonstrate abdominal breathing in structured speech tasks 18/20 responses with occasional min A    Time  3    Period  Weeks    Status  On-going  SLP SHORT TERM GOAL #3   Title  Pt will report 25% reduction in dysphonia, subjectively, over 2 sessions    Time  3    Period  Weeks    Status  On-going       SLP Long Term Goals - 03/13/17 1652      SLP LONG TERM GOAL #1   Title  Pt will perform HEP for dysphonia with rare min A over 3 sessions    Time  7    Period  Weeks    Status  On-going      SLP LONG TERM GOAL #2   Title  Pt will demonstrate abdominal breathing and adequate volume in simple conversation over 8 minutes with rare min A    Time  7    Period  Weeks    Status  On-going      SLP LONG TERM GOAL #3   Title  Pt will achieve WNL phonation over 15 minute conversation with rare min A    Time  7    Period  Weeks    Status  On-going      SLP LONG TERM GOAL #5   Title  Pt will report 50% reduction in dysphonia subjectively, over 3 sessions     Time  [place] - 03/13/17 1457    Clinical Impression Statement  Mrs. Toy Cookey again required SLP A for completing her HEP with proper procedure today. After focus on incr'd abdominal strength and improved vocal quality with her HEP, improved vocal quality/WNL voicing was heard with reading everyday sentences. Continue skilled ST to maximize intellgibility in community activities.     Speech Therapy Frequency  2x / week    Treatment/Interventions  Cueing hierarchy;Multimodal communcation approach;Functional tasks;Patient/family education;Compensatory techniques;Environmental controls;Internal/external aids;SLP instruction and feedback    Potential to Achieve Goals  Fair    Potential Considerations  Severity of impairments    SLP Home Exercise Plan  HEP for vocal fold adduction - see pt instructions    Consulted and Agree with Plan of Care  Patient       Patient will benefit from skilled therapeutic intervention in order to improve the following deficits and impairments:   Other voice and resonance disorders    Problem List Patient Active Problem List   Diagnosis Date Noted  . Allergic rhinitis 01/01/2016  . Bronchiectasis without acute exacerbation ([place]) 11/03/2014  . COPD with emphysema ([place]) 11/03/2014  . Lung cancer ([place]) 11/03/2014  . Peripheral neuralgia 11/03/2014    [facility] ,[place], CCC-SLP  03/13/2017, 4:52 PM  [facility] [address] [place], [place], [address] Phone: [phone]   Fax:  [phone]   Name: Cynthis Purington MRN: [id number] Date of Birth: 05/14/1928

## 2017-03-13 NOTE — Patient Instructions (Signed)
  Read 10 of the "conversational sentences" twice-three times a day  Read out loud for 30-60 seconds 15 times - twice-times a day   (FEEL YOUR ABDOMINAL MUSCLES WORKING!!)

## 2017-03-17 ENCOUNTER — Encounter: Payer: Self-pay | Admitting: Speech Pathology

## 2017-03-17 ENCOUNTER — Other Ambulatory Visit: Payer: Self-pay

## 2017-03-17 ENCOUNTER — Ambulatory Visit: Payer: Medicare Other | Admitting: Speech Pathology

## 2017-03-17 DIAGNOSIS — R498 Other voice and resonance disorders: Secondary | ICD-10-CM

## 2017-03-17 NOTE — Patient Instructions (Signed)
  Continue practicing big breathing before you talk  Think about lowering your pitch slightly and using breath to power your voice  When someone doesn't hear your or understand you, take a big breath and think "powerful voice"  Continue your exercises and practice as before  This week - practice coordinating abdominal breathing, lower pitch and power voice with conversations as you talk to others

## 2017-03-17 NOTE — Therapy (Signed)
[facility] [address] [place], [place], [address] Phone: [phone]   Fax:  [phone]  Speech Language Pathology Treatment  Patient Details  Name: Jocelyn Ramirez MRN: [id number] Date of Birth: 31-May-1928 Referring Provider: Dr. Gavin Pound   Encounter Date: 03/17/2017  End of Session - 03/17/17 1212    Visit Number  5    Number of Visits  17    Date for SLP Re-Evaluation  04/24/17    SLP Start Time  0932    SLP Stop Time   6283    SLP Time Calculation (min)  43 min    Activity Tolerance  Patient tolerated treatment well       Past Medical History:  Diagnosis Date  . COPD (chronic obstructive pulmonary disease) ([place])   . Lung cancer ([place])   . Peripheral neuropathy     Past Surgical History:  Procedure Laterality Date  . endometrial tumor    . perforated abcess on colon  1992  . RLL lobectomy  11/1992  . VAGINAL HYSTERECTOMY      There were no vitals filed for this visit.  Subjective Assessment - 03/17/17 0939    Subjective  "I do my exercises walking around the house so it's more interesting"            ADULT SLP TREATMENT - 03/17/17 0949      General Information   Behavior/Cognition  Alert;Cooperative;Pleasant mood      Treatment Provided   Treatment provided  Cognitive-Linquistic      Pain Assessment   Pain Assessment  No/denies pain      Cognitive-Linquistic Treatment   Treatment focused on  Voice    Skilled Treatment  Abdmonimal breathing (AB) in isolation with occasional min A. After AB, utilized "HEY" and "AH" with reduced hoarseness. Dysphonia also improves with reduced pitch. HEP for dysphonia with occasional min A for supraglottic swallow, all others with rare min A. Oral reading short sentences with AB and power voice with 85% clear phonation. Spontaneous speech in structured tasks (phono descriptions) with occasional min A for breath support, lower pitch. Voice quality  improved with these compensations.       Assessment / Recommendations / Plan   Plan  Continue with current plan of care       SLP Education - 03/17/17 1210    Education provided  Yes    Education Details  Abdmonimal breathing in coversation, slightly lower pitch    Person(s) Educated  Patient    Methods  Explanation;Handout;Verbal cues    Comprehension  Verbalized understanding;Returned demonstration;Verbal cues required       SLP Short Term Goals - 03/17/17 1211      SLP SHORT TERM GOAL #1   Title  Pt will perfrom HEP for vocal fold adduction with occasional min A over 2 sessions    Time  2    Period  Weeks    Status  On-going      SLP SHORT TERM GOAL #2   Title  Pt will demonstrate abdominal breathing in structured speech tasks 18/20 responses with occasional min A    Time  2    Period  Weeks    Status  On-going      SLP SHORT TERM GOAL #3   Title  Pt will report 25% reduction in dysphonia, subjectively, over 2 sessions    Time  2    Period  Weeks    Status  On-going  SLP Long Term Goals - 03/17/17 1212      SLP LONG TERM GOAL #1   Title  Pt will perform HEP for dysphonia with rare min A over 3 sessions    Time  6    Period  Weeks    Status  On-going      SLP LONG TERM GOAL #2   Title  Pt will demonstrate abdominal breathing and adequate volume in simple conversation over 8 minutes with rare min A    Time  6    Period  Weeks    Status  On-going      SLP LONG TERM GOAL #3   Title  Pt will achieve WNL phonation over 15 minute conversation with rare min A    Time  6    Period  Weeks    Status  On-going      SLP LONG TERM GOAL #5   Title  Pt will report 50% reduction in dysphonia subjectively, over 3 sessions    Time  8    Period  Weeks    Status  New       Plan - 03/17/17 1211    Clinical Impression Statement  Mrs. Toy Cookey again required SLP A for completing her HEP with proper procedure today. After focus on incr'd abdominal strength and improved  vocal quality with her HEP, improved vocal quality/WNL voicing was heard with reading everyday sentences. Continue skilled ST to maximize intellgibility in community activities.     Speech Therapy Frequency  2x / week    Treatment/Interventions  Cueing hierarchy;Multimodal communcation approach;Functional tasks;Patient/family education;Compensatory techniques;Environmental controls;Internal/external aids;SLP instruction and feedback    Potential to Achieve Goals  Fair    Potential Considerations  Severity of impairments    SLP Home Exercise Plan  HEP for vocal fold adduction - see pt instructions       Patient will benefit from skilled therapeutic intervention in order to improve the following deficits and impairments:   Other voice and resonance disorders    Problem List Patient Active Problem List   Diagnosis Date Noted  . Allergic rhinitis 01/01/2016  . Bronchiectasis without acute exacerbation ([place]) 11/03/2014  . COPD with emphysema ([place]) 11/03/2014  . Lung cancer ([place]) 11/03/2014  . Peripheral neuralgia 11/03/2014    Lindzey Zent, Annye Rusk MS, CCC-SLP 03/17/2017, 12:13 PM  [facility] [address] [place], [place], [address] Phone: [phone]   Fax:  [phone]   Name: Chrishana Spargur MRN: [id number] Date of Birth: 1928/06/11

## 2017-03-24 ENCOUNTER — Encounter: Payer: Self-pay | Admitting: Speech Pathology

## 2017-03-24 ENCOUNTER — Ambulatory Visit: Payer: Medicare Other | Admitting: Speech Pathology

## 2017-03-24 ENCOUNTER — Other Ambulatory Visit: Payer: Self-pay

## 2017-03-24 DIAGNOSIS — R498 Other voice and resonance disorders: Secondary | ICD-10-CM

## 2017-03-24 NOTE — Therapy (Signed)
[facility] [address] [place], [place], [address] Phone: [phone]   Fax:  [phone]  Speech Language Pathology Treatment  Patient Details  Name: Jocelyn Ramirez MRN: [id number] Date of Birth: [birth year] Referring Provider: Dr. Gavin Pound   Encounter Date: 03/24/2017  End of Session - 03/24/17 1320    Visit Number  6    Number of Visits  17    Date for SLP Re-Evaluation  04/24/17    SLP Start Time  3382    SLP Stop Time   1315    SLP Time Calculation (min)  40 min    Activity Tolerance  Patient tolerated treatment well       Past Medical History:  Diagnosis Date  . COPD (chronic obstructive pulmonary disease) ([place])   . Lung cancer ([place])   . Peripheral neuropathy     Past Surgical History:  Procedure Laterality Date  . endometrial tumor    . perforated abcess on colon  1992  . RLL lobectomy  11/1992  . VAGINAL HYSTERECTOMY      There were no vitals filed for this visit.  Subjective Assessment - 03/24/17 1236    Subjective  Pt arrived 5 minutes late for ST - "This wasn't a good week for practicing"    Currently in Pain?  No/denies            ADULT SLP TREATMENT - 03/24/17 1241      General Information   Behavior/Cognition  Alert;Cooperative;Pleasant mood      Treatment Provided   Treatment provided  Cognitive-Linquistic      Pain Assessment   Pain Assessment  No/denies pain      Cognitive-Linquistic Treatment   Treatment focused on  Voice    Skilled Treatment  Pt reports limited practice due to vertigo episodes since last visit. HEP for vocal fold adduction with rare min A, except supraglottic swallow, which required occasional mod A. Structured speech tasks focusing on "strong , powerful" voice with occasional min A 85% clear phonation. Limited carryover of strong voice inconversation with usual min to mod A.       Assessment / Recommendations / Plan   Plan  Continue with  current plan of care      Progression Toward Goals   Progression toward goals  Progressing toward goals       SLP Education - 03/24/17 1315    Education provided  Yes    Education Details  Continue HEP as directed, practice "strong" voice in conversations    Person(s) Educated  Patient    Methods  Explanation;Demonstration    Comprehension  Verbalized understanding;Verbal cues required       SLP Short Term Goals - 03/24/17 1318      SLP SHORT TERM GOAL #1   Title  Pt will perfrom HEP for vocal fold adduction with occasional min A over 2 sessions    Baseline  03/24/17;     Time  1    Period  Weeks    Status  On-going      SLP SHORT TERM GOAL #2   Title  Pt will demonstrate abdominal breathing in structured speech tasks 18/20 responses with occasional min A    Time  1    Period  Weeks    Status  On-going      SLP SHORT TERM GOAL #3   Title  Pt will report 25% reduction in dysphonia, subjectively, over 2 sessions  Time  1    Period  Weeks    Status  On-going       SLP Long Term Goals - 03/24/17 1319      SLP LONG TERM GOAL #1   Title  Pt will perform HEP for dysphonia with rare min A over 3 sessions    Time  5    Period  Weeks    Status  On-going      SLP LONG TERM GOAL #2   Title  Pt will demonstrate abdominal breathing and adequate volume in simple conversation over 8 minutes with rare min A    Time  5    Period  Weeks    Status  On-going      SLP LONG TERM GOAL #3   Title  Pt will achieve WNL phonation over 15 minute conversation with rare min A    Time  5    Period  Weeks    Status  On-going      SLP LONG TERM GOAL #5   Title  Pt will report 50% reduction in dysphonia subjectively, over 3 sessions    Time  8    Period  Weeks    Status  New       Plan - 03/24/17 1316    Clinical Impression Statement  Improvement with accuracy of HEP with mod I to min A. Utilized breath support of "strong" "power" voice in structured tasks with improved vocal  quality, however pt required mod A to carry this over in conversation. Dysphonia continues to reduce intelligiblity.  Contiue skilled ST to maxmize intellgiblity.     Speech Therapy Frequency  2x / week    Treatment/Interventions  Cueing hierarchy;Multimodal communcation approach;Functional tasks;Patient/family education;Compensatory techniques;Environmental controls;Internal/external aids;SLP instruction and feedback    Potential to Achieve Goals  Fair    Potential Considerations  Severity of impairments    SLP Home Exercise Plan  HEP for vocal fold adduction - see pt instructions    Consulted and Agree with Plan of Care  Patient       Patient will benefit from skilled therapeutic intervention in order to improve the following deficits and impairments:   Other voice and resonance disorders    Problem List Patient Active Problem List   Diagnosis Date Noted  . Allergic rhinitis 01/01/2016  . Bronchiectasis without acute exacerbation ([place]) 11/03/2014  . COPD with emphysema ([place]) 11/03/2014  . Lung cancer ([place]) 11/03/2014  . Peripheral neuralgia 11/03/2014    Mauria Asquith, Annye Rusk MS, CCC-SLP 03/24/2017, 1:21 PM  [facility] [address] [place], [place], [address] Phone: [phone]   Fax:  [phone]   Name: Jocelyn Ramirez MRN: [id number] Date of Birth: 1928/10/11

## 2017-03-26 ENCOUNTER — Ambulatory Visit: Payer: Medicare Other | Admitting: Speech Pathology

## 2017-03-27 DIAGNOSIS — N200 Calculus of kidney: Secondary | ICD-10-CM | POA: Diagnosis not present

## 2017-03-27 DIAGNOSIS — K429 Umbilical hernia without obstruction or gangrene: Secondary | ICD-10-CM | POA: Diagnosis not present

## 2017-03-31 ENCOUNTER — Ambulatory Visit: Payer: Medicare Other | Attending: Otolaryngology

## 2017-03-31 ENCOUNTER — Other Ambulatory Visit: Payer: Self-pay

## 2017-03-31 DIAGNOSIS — R498 Other voice and resonance disorders: Secondary | ICD-10-CM | POA: Insufficient documentation

## 2017-03-31 NOTE — Therapy (Signed)
[facility] [address] [place], [place], [address] Phone: [phone]   Fax:  [phone]  Speech Language Pathology Treatment  Patient Details  Name: Jocelyn Ramirez MRN: [id number] Date of Birth: November 05, 1928 Referring Provider: Dr. Gavin Pound   Encounter Date: 03/31/2017  End of Session - 03/31/17 1641    Visit Number  7    Number of Visits  17    Date for SLP Re-Evaluation  04/24/17    SLP Start Time  1105    SLP Stop Time   1146    SLP Time Calculation (min)  41 min    Activity Tolerance  Patient tolerated treatment well       Past Medical History:  Diagnosis Date  . COPD (chronic obstructive pulmonary disease) ([place])   . Lung cancer ([place])   . Peripheral neuropathy     Past Surgical History:  Procedure Laterality Date  . endometrial tumor    . perforated abcess on colon  1992  . RLL lobectomy  11/1992  . VAGINAL HYSTERECTOMY      There were no vitals filed for this visit.  Subjective Assessment - 03/31/17 1111    Subjective  Pt went to funeral yesterday and was not satisfied with her voice.     Currently in Pain?  No/denies            ADULT SLP TREATMENT - 03/31/17 1112      General Information   Behavior/Cognition  Alert;Cooperative;Pleasant mood      Treatment Provided   Treatment provided  Cognitive-Linquistic      Cognitive-Linquistic Treatment   Treatment focused on  Voice    Skilled Treatment  Pt has been completing exercises every day, most of the time piecemeal and rarely one after the other. SLP stressed the need to practice at least 30 minutes each day with her "stronger voice" with structured tasks. In strucutred tasks, pt was not able to tell SLP if she felt incr'd power in abdominals/diaphragm, however her voice was consistently better quality for 90% of utterances of 1-2 sentences. Frequently pt voice volume decr'd in last few words as she ran out of breath. SLP focused  pt on thinking about talking with her "strong voice" and this consistently improved pt's vocal quality despite whether or not pt could feel abdominals/diaphragm working. Pt appeared to use abdominal breathing (AB) in those tasks >50% of the time. In conversation pt vocal quality reverted mainly to breathy and hoarse quality even with consistent mod SLP cues.       Assessment / Recommendations / Plan   Plan  Continue with current plan of care      Progression Toward Goals   Progression toward goals  Progressing toward goals       SLP Education - 03/31/17 1641    Education provided  Yes    Education Details  must practice "strong voice" every day for 30 minutes, and use in conversations    Person(s) Educated  Patient    Methods  Explanation;Demonstration    Comprehension  Verbalized understanding;Verbal cues required       SLP Short Term Goals - 03/31/17 1115      SLP SHORT TERM GOAL #1   Title  Pt will perfrom HEP for vocal fold adduction with occasional min A over 2 sessions    Baseline  03/24/17;     Status  Partially Met one of two sessions      SLP SHORT TERM  GOAL #2   Title  Pt will demonstrate abdominal breathing in structured speech tasks 18/20 responses with occasional min A    Time  --    Period  --    Status  Achieved      SLP SHORT TERM GOAL #3   Title  Pt will report 25% reduction in dysphonia, subjectively, over 2 sessions    Time  --    Period  --    Status  Partially Met       SLP Long Term Goals - 03/31/17 1644      SLP LONG TERM GOAL #1   Title  Pt will perform HEP for dysphonia with rare min A over 3 sessions    Time  4    Period  Weeks    Status  On-going      SLP LONG TERM GOAL #2   Title  Pt will demonstrate abdominal breathing and adequate volume in simple conversation over 8 minutes with rare min A    Time  4    Period  Weeks    Status  On-going      SLP LONG TERM GOAL #3   Title  Pt will achieve WNL phonation over 15 minute conversation  with rare min A    Time  4    Period  Weeks    Status  On-going      SLP LONG TERM GOAL #5   Title  Pt will report 50% reduction in dysphonia subjectively, over 3 sessions    Time  8    Period  Weeks    Status  New       Plan - 03/31/17 1642    Clinical Impression Statement  Pt utilized breath support to generate "strong voice" in structured tasks with improved vocal quality, however pt could not feel incr'd use of abdominal/diaphragmatic musculature (SLP focused on change for improved vocal quality). Pt showed very little carryover into conversational speech - SLP told her she needed to practice "strong voice" in structured tasks 30 minutes per day, and also in conversations each day to make this habitual. Dysphonia continues to somewhat reduce intelligiblity.  Contiue skilled ST to maxmize intellgiblity.     Speech Therapy Frequency  2x / week    Treatment/Interventions  Cueing hierarchy;Multimodal communcation approach;Functional tasks;Patient/family education;Compensatory techniques;Environmental controls;Internal/external aids;SLP instruction and feedback    Potential to Achieve Goals  Fair    Potential Considerations  Severity of impairments    SLP Home Exercise Plan  HEP for vocal fold adduction - see pt instructions    Consulted and Agree with Plan of Care  Patient       Patient will benefit from skilled therapeutic intervention in order to improve the following deficits and impairments:   Other voice and resonance disorders    Problem List Patient Active Problem List   Diagnosis Date Noted  . Allergic rhinitis 01/01/2016  . Bronchiectasis without acute exacerbation ([place]) 11/03/2014  . COPD with emphysema ([place]) 11/03/2014  . Lung cancer ([place]) 11/03/2014  . Peripheral neuralgia 11/03/2014    [facility] ,MS, CCC-SLP  03/31/2017, 4:45 PM  [facility] [address] [place], [place], [address] Phone:  [phone]   Fax:  [phone]   Name: Jocelyn Ramirez MRN: [id number] Date of Birth: 02-Dec-1928

## 2017-03-31 NOTE — Patient Instructions (Signed)
You must practice 30 minutes a day with your strong voice to have it carry over in conversation!

## 2017-04-02 ENCOUNTER — Ambulatory Visit: Payer: Medicare Other

## 2017-04-02 ENCOUNTER — Other Ambulatory Visit: Payer: Self-pay

## 2017-04-02 DIAGNOSIS — R498 Other voice and resonance disorders: Secondary | ICD-10-CM

## 2017-04-02 NOTE — Patient Instructions (Signed)
  Please complete the assigned speech therapy homework prior to your next session and return it to the speech therapist at your next visit.  

## 2017-04-02 NOTE — Therapy (Signed)
[facility] [address] [place], [place], [address] Phone: [phone]   Fax:  [phone]  Speech Language Pathology Treatment  Patient Details  Name: Jocelyn Ramirez MRN: [id number] Date of Birth: 06-Feb-1929 Referring Provider: Dr. Gavin Pound   Encounter Date: 04/02/2017  End of Session - 04/02/17 1317    Visit Number  8    Number of Visits  17    Date for SLP Re-Evaluation  04/24/17    SLP Start Time  1147    SLP Stop Time   1231    SLP Time Calculation (min)  44 min    Activity Tolerance  Patient tolerated treatment well       Past Medical History:  Diagnosis Date  . COPD (chronic obstructive pulmonary disease) ([place])   . Lung cancer ([place])   . Peripheral neuropathy     Past Surgical History:  Procedure Laterality Date  . endometrial tumor    . perforated abcess on colon  1992  . RLL lobectomy  11/1992  . VAGINAL HYSTERECTOMY      There were no vitals filed for this visit.  Subjective Assessment - 04/02/17 1107    Subjective  Pt's voice sounds better, perceptually, than previous session.    Currently in Pain?  No/denies            ADULT SLP TREATMENT - 04/02/17 1108      General Information   Behavior/Cognition  Alert;Cooperative;Pleasant mood      Treatment Provided   Treatment provided  Cognitive-Linquistic      Cognitive-Linquistic Treatment   Treatment focused on  Voice    Skilled Treatment  Pt reports practicing with her strong voice (Strucutred tasks).In strucutred tasks today, SLP told pt to focus on pt thinking of one word "STRONG" to decr pt need for multitasking (content and need for strong voice).  Pt generated voice with improved vocal quality 60% of the time in multi-sentence responses. SLP encouraged pt to wear external cue for reminder to use "strong voice." Pt stated she would begin to wear a ring.      Assessment / Recommendations / Plan   Plan  Continue with  current plan of care      Progression Toward Goals   Progression toward goals  Progressing toward goals       SLP Education - 04/02/17 1316    Education provided  Yes    Education Details  external cues for "strong voice"    Person(s) Educated  Patient    Methods  Explanation    Comprehension  Verbalized understanding       SLP Short Term Goals - 03/31/17 1115      SLP SHORT TERM GOAL #1   Title  Pt will perfrom HEP for vocal fold adduction with occasional min A over 2 sessions    Baseline  03/24/17;     Status  Partially Met one of two sessions      SLP SHORT TERM GOAL #2   Title  Pt will demonstrate abdominal breathing in structured speech tasks 18/20 responses with occasional min A    Time  --    Period  --    Status  Achieved      SLP SHORT TERM GOAL #3   Title  Pt will report 25% reduction in dysphonia, subjectively, over 2 sessions    Time  --    Period  --    Status  Partially Met  SLP Long Term Goals - 04/02/17 1319      SLP LONG TERM GOAL #1   Title  Pt will perform HEP for dysphonia with rare min A over 3 sessions    Time  4    Period  Weeks    Status  On-going      SLP LONG TERM GOAL #2   Title  Pt will demonstrate abdominal breathing and adequate volume in simple conversation over 8 minutes with rare min A    Time  4    Period  Weeks    Status  On-going      SLP LONG TERM GOAL #3   Title  Pt will achieve WNL phonation over 15 minute conversation with rare min A    Time  4    Period  Weeks    Status  On-going      SLP LONG TERM GOAL #5   Title  Pt will report 50% reduction in dysphonia subjectively, over 3 sessions    Time  8    Period  Weeks    Status  New       Plan - 04/02/17 1317    Clinical Impression Statement  Pt utilized breath support to generate "strong voice" in structured tasks of 2 sentence responses with improved vocal quality 60% of the time. SLP req'd to cue pt due to oververbosity and gradual reduced focus on improving  her vocal quality.  However pt did better with feeling incr'd use of abdominal/diaphragmatic musculature. Pt again showed very little carryover into conversational speech. External cues for abdominal push ("Strong voice") were strongly encouraged. Dysphonia continues to somewhat reduce intelligiblity.  Contiue skilled ST to maxmize intellgiblity.     Speech Therapy Frequency  2x / week    Duration  -- 8 weeks    Treatment/Interventions  Cueing hierarchy;Multimodal communcation approach;Functional tasks;Patient/family education;Compensatory techniques;Environmental controls;Internal/external aids;SLP instruction and feedback    Potential to Achieve Goals  Fair    Potential Considerations  Severity of impairments    SLP Home Exercise Plan  HEP for vocal fold adduction - see pt instructions    Consulted and Agree with Plan of Care  Patient       Patient will benefit from skilled therapeutic intervention in order to improve the following deficits and impairments:   Other voice and resonance disorders    Problem List Patient Active Problem List   Diagnosis Date Noted  . Allergic rhinitis 01/01/2016  . Bronchiectasis without acute exacerbation ([place]) 11/03/2014  . COPD with emphysema ([place]) 11/03/2014  . Lung cancer ([place]) 11/03/2014  . Peripheral neuralgia 11/03/2014    [facility] ,MS, CCC-SLP  04/02/2017, 1:20 PM  [facility] [address] [place], [place], [address] Phone: [phone]   Fax:  [phone]   Name: Jocelyn Ramirez MRN: [id number] Date of Birth: 1929/04/18

## 2017-04-07 ENCOUNTER — Ambulatory Visit: Payer: Medicare Other | Admitting: Speech Pathology

## 2017-04-08 ENCOUNTER — Other Ambulatory Visit: Payer: Self-pay | Admitting: Pulmonary Disease

## 2017-04-09 ENCOUNTER — Encounter: Payer: Medicare Other | Admitting: Speech Pathology

## 2017-04-09 ENCOUNTER — Ambulatory Visit: Payer: Medicare Other

## 2017-04-09 DIAGNOSIS — R1907 Generalized intra-abdominal and pelvic swelling, mass and lump: Secondary | ICD-10-CM | POA: Diagnosis not present

## 2017-04-14 ENCOUNTER — Encounter: Payer: Medicare Other | Admitting: Speech Pathology

## 2017-04-15 ENCOUNTER — Ambulatory Visit: Payer: Medicare Other | Admitting: Speech Pathology

## 2017-04-15 ENCOUNTER — Other Ambulatory Visit: Payer: Self-pay

## 2017-04-15 ENCOUNTER — Encounter: Payer: Self-pay | Admitting: Speech Pathology

## 2017-04-15 DIAGNOSIS — R498 Other voice and resonance disorders: Secondary | ICD-10-CM | POA: Diagnosis not present

## 2017-04-15 DIAGNOSIS — K639 Disease of intestine, unspecified: Secondary | ICD-10-CM | POA: Diagnosis not present

## 2017-04-15 NOTE — Therapy (Signed)
[facility] [address] [place], [place], [address] Phone: [phone]   Fax:  [phone]  Speech Language Pathology Treatment  Patient Details  Name: Jocelyn Ramirez MRN: [id number] Date of Birth: 07/26/1928 Referring Provider: Dr. Gavin Pound   Encounter Date: 04/15/2017  End of Session - 04/15/17 1454    Visit Number  9    Number of Visits  17    Date for SLP Re-Evaluation  05/08/17    Authorization Type  changed re-eval date due to pt missing 2 weeks of therapy     SLP Start Time  1403    SLP Stop Time   1448    SLP Time Calculation (min)  45 min    Activity Tolerance  Patient tolerated treatment well       Past Medical History:  Diagnosis Date  . COPD (chronic obstructive pulmonary disease) ([place])   . Lung cancer ([place])   . Peripheral neuropathy     Past Surgical History:  Procedure Laterality Date  . endometrial tumor    . perforated abcess on colon  1992  . RLL lobectomy  11/1992  . VAGINAL HYSTERECTOMY      There were no vitals filed for this visit.  Subjective Assessment - 04/15/17 1406    Subjective  "I wasn't here last week because I had to talk to a surgeon as they found a mass in my bowel"    Currently in Pain?  No/denies            ADULT SLP TREATMENT - 04/15/17 1408      General Information   Behavior/Cognition  Alert;Cooperative;Pleasant mood      Treatment Provided   Treatment provided  Cognitive-Linquistic      Cognitive-Linquistic Treatment   Treatment focused on  Voice    Skilled Treatment  Pt reports her voice is better in the morning, as prior to start of ST, she could not get her voice "on."  She spoke on the phone with her MD with no difficulty or requests for repeat. Today, pt utilizing optimum pitch and strong voice with min A in structured speech tasks. She required usual min cues to carryover breath support and "strong" voice in conversation, however pt able  to improve voice with min verbal cues.. Pt reports daily completion of HEP for voice.       Assessment / Recommendations / Plan   Plan  Continue with current plan of care      Progression Toward Goals   Progression toward goals  Progressing toward goals       SLP Education - 04/15/17 1448    Education provided  Yes    Education Details  continue exercises, oral reading, and breath support for strong voice    Person(s) Educated  Patient    Methods  Explanation    Comprehension  Verbalized understanding       SLP Short Term Goals - 04/15/17 1453      SLP SHORT TERM GOAL #1   Title  Pt will perfrom HEP for vocal fold adduction with occasional min A over 2 sessions    Baseline  03/24/17;     Status  Partially Met one of two sessions      SLP SHORT TERM GOAL #2   Title  Pt will demonstrate abdominal breathing in structured speech tasks 18/20 responses with occasional min A    Status  Achieved      SLP SHORT TERM GOAL #  3   Title  Pt will report 25% reduction in dysphonia, subjectively, over 2 sessions    Status  Partially Met       SLP Long Term Goals - 04/15/17 1454      SLP LONG TERM GOAL #1   Title  Pt will perform HEP for dysphonia with rare min A over 3 sessions    Time  3    Period  Weeks    Status  On-going      SLP LONG TERM GOAL #2   Title  Pt will demonstrate abdominal breathing and adequate volume in simple conversation over 8 minutes with rare min A    Time  3    Period  Weeks    Status  On-going      SLP LONG TERM GOAL #3   Title  Pt will achieve WNL phonation over 15 minute conversation with rare min A    Time  3    Period  Weeks    Status  On-going      SLP LONG TERM GOAL #5   Title  Pt will report 50% reduction in dysphonia subjectively, over 3 sessions    Time  8    Period  Weeks    Status  New       Plan - 04/15/17 1450    Clinical Impression Statement  Pt continues to complete HEP at home. Abdominal breathing and "strong" voice in  structured sentence taks with occasional min A, resulting in improved voice quality. Pt required usual min a to carry over these strategies to conversation, however, with min cue, pt successfully improve voice quality. Continue skilled ST to maximize phonation for improved intellgilbity.     Speech Therapy Frequency  2x / week    Treatment/Interventions  Cueing hierarchy;Multimodal communcation approach;Functional tasks;Patient/family education;Compensatory techniques;Environmental controls;Internal/external aids;SLP instruction and feedback    Potential to Achieve Goals  Fair    Potential Considerations  Severity of impairments    Consulted and Agree with Plan of Care  Patient       Patient will benefit from skilled therapeutic intervention in order to improve the following deficits and impairments:   Other voice and resonance disorders    Problem List Patient Active Problem List   Diagnosis Date Noted  . Allergic rhinitis 01/01/2016  . Bronchiectasis without acute exacerbation ([place]) 11/03/2014  . COPD with emphysema ([place]) 11/03/2014  . Lung cancer ([place]) 11/03/2014  . Peripheral neuralgia 11/03/2014    [place], Annye Rusk MS, CCC-SLP 04/15/2017, 2:55 PM  [facility] [address] [place], [place], [address] Phone: [phone]   Fax:  [phone]   Name: Jocelyn Ramirez MRN: [id number] Date of Birth: Mar 25, 1929

## 2017-04-15 NOTE — Patient Instructions (Signed)
   Continue exercises daily  Read aloud focusing on breath support, lower pitch and STRONG voice, especially am, to get your voice going  Use external reminder to use abdominal breathing and strong voice (ring)

## 2017-04-16 ENCOUNTER — Encounter: Payer: Self-pay | Admitting: Speech Pathology

## 2017-04-16 ENCOUNTER — Other Ambulatory Visit: Payer: Self-pay

## 2017-04-16 ENCOUNTER — Ambulatory Visit: Payer: Medicare Other | Admitting: Speech Pathology

## 2017-04-16 DIAGNOSIS — R498 Other voice and resonance disorders: Secondary | ICD-10-CM | POA: Diagnosis not present

## 2017-04-16 DIAGNOSIS — N3010 Interstitial cystitis (chronic) without hematuria: Secondary | ICD-10-CM | POA: Diagnosis not present

## 2017-04-16 NOTE — Therapy (Signed)
[facility] [address] [place], [place], [address] Phone: [phone]   Fax:  [phone]  Speech Language Pathology Treatment  Patient Details  Name: Jocelyn Ramirez MRN: [id number] Date of Birth: 03/09/1929 Referring Provider: Dr. Gavin Pound   Encounter Date: 04/16/2017  End of Session - 04/16/17 1900    Visit Number  10    Number of Visits  17    Date for SLP Re-Evaluation  05/08/17    Authorization Type  changed re-eval date due to pt missing 2 weeks of therapy     SLP Start Time  1104    SLP Stop Time   1151    SLP Time Calculation (min)  47 min    Activity Tolerance  Patient tolerated treatment well       Past Medical History:  Diagnosis Date  . COPD (chronic obstructive pulmonary disease) ([place])   . Lung cancer ([place])   . Peripheral neuropathy     Past Surgical History:  Procedure Laterality Date  . endometrial tumor    . perforated abcess on colon  1992  . RLL lobectomy  11/1992  . VAGINAL HYSTERECTOMY      There were no vitals filed for this visit.  Subjective Assessment - 04/16/17 1110    Subjective  "I'm mindful that I have to show the strong voice and not even think about it."    Currently in Pain?  No/denies            ADULT SLP TREATMENT - 04/16/17 1104      General Information   Behavior/Cognition  Alert;Cooperative;Pleasant mood    Patient Positioning  Upright in chair      Treatment Provided   Treatment provided  Cognitive-Linquistic      Pain Assessment   Pain Assessment  No/denies pain      Cognitive-Linquistic Treatment   Treatment focused on  Voice    Skilled Treatment  Pt told SLP strategies she has been working on to improve her vocal quality including abdominal breathing, lowering her pitch and using a "strong" voice. Demo'd adequate pitch and strong voice with rare min A in structured speech tasks, progressing from words to short 2-3 minute simple  conversations. Pt demo'd self-monitoring by identifying and correcting pitch in 2 opportunities and soft voice x1.       Assessment / Recommendations / Plan   Plan  Continue with current plan of care      Progression Toward Goals   Progression toward goals  Progressing toward goals       SLP Education - 04/15/17 1448    Education provided  Yes    Education Details  continue exercises, oral reading, and breath support for strong voice    Person(s) Educated  Patient    Methods  Explanation    Comprehension  Verbalized understanding       SLP Short Term Goals - 04/16/17 1902      SLP SHORT TERM GOAL #1   Title  Pt will perfrom HEP for vocal fold adduction with occasional min A over 2 sessions    Status  Partially Met      SLP SHORT TERM GOAL #2   Title  Pt will demonstrate abdominal breathing in structured speech tasks 18/20 responses with occasional min A    Status  Achieved      SLP SHORT TERM GOAL #3   Title  Pt will report 25% reduction in dysphonia, subjectively, over 2  sessions    Status  Partially Met       SLP Long Term Goals - 04/16/17 1902      SLP LONG TERM GOAL #1   Title  Pt will perform HEP for dysphonia with rare min A over 3 sessions    Time  3    Period  Weeks    Status  On-going      SLP LONG TERM GOAL #2   Title  Pt will demonstrate abdominal breathing and adequate volume in simple conversation over 8 minutes with rare min A    Time  3    Period  Weeks    Status  On-going      SLP LONG TERM GOAL #3   Title  Pt will achieve WNL phonation over 15 minute conversation with rare min A    Time  3    Period  Weeks    Status  On-going       Plan - 04/16/17 1901    Clinical Impression Statement  Pt continues to complete HEP at home. Abdominal breathing and "strong" voice in structured sentence taks with rare min A, resulting in improved voice quality. Pt demo'd reduced need for cues to carry over vocal techniques to short simple conversational  responses. Continue skilled ST to maximize phonation for improved intelligibility.     Speech Therapy Frequency  2x / week    Treatment/Interventions  Cueing hierarchy;Multimodal communcation approach;Functional tasks;Patient/family education;Compensatory techniques;Environmental controls;Internal/external aids;SLP instruction and feedback    Potential to Achieve Goals  Fair    Potential Considerations  Severity of impairments    SLP Home Exercise Plan  HEP for vocal fold adduction - see pt instructions    Consulted and Agree with Plan of Care  Patient       Patient will benefit from skilled therapeutic intervention in order to improve the following deficits and impairments:   Other voice and resonance disorders    Problem List Patient Active Problem List   Diagnosis Date Noted  . Allergic rhinitis 01/01/2016  . Bronchiectasis without acute exacerbation ([place]) 11/03/2014  . COPD with emphysema ([place]) 11/03/2014  . Lung cancer ([place]) 11/03/2014  . Peripheral neuralgia 11/03/2014   Speech Therapy Progress Note  Dates of Reporting Period: 02/24/17 to 04/16/17  Subjective Statement: Pt has been seen for 10 speech therapy visits addressing dysphonia.   Objective Measurements: Pt required rare min A for carryover of abdominal breathing, strong voice and optimum pitch to short 2-3 minute conversational responses.  Goal Update: see goals above  Plan: Continue current plan of care   Reason Skilled Services are Required: Skilled ST remains necessary to improve carryover of trained vocal techniques to conversation.   Deneise Lever, [place], CCC-SLP Speech-Language Pathologist  Aliene Altes 04/16/2017, 7:03 PM  [facility] [address] [place], [place], [address] Phone: [phone]   Fax:  [phone]   Name: Jocelyn Ramirez MRN: [id number] Date of Birth: January 11, 1929

## 2017-04-30 ENCOUNTER — Ambulatory Visit: Payer: Medicare Other | Attending: Otolaryngology

## 2017-04-30 ENCOUNTER — Other Ambulatory Visit: Payer: Self-pay

## 2017-04-30 DIAGNOSIS — R42 Dizziness and giddiness: Secondary | ICD-10-CM | POA: Insufficient documentation

## 2017-04-30 DIAGNOSIS — M6281 Muscle weakness (generalized): Secondary | ICD-10-CM | POA: Insufficient documentation

## 2017-04-30 DIAGNOSIS — R2681 Unsteadiness on feet: Secondary | ICD-10-CM | POA: Insufficient documentation

## 2017-04-30 DIAGNOSIS — R498 Other voice and resonance disorders: Secondary | ICD-10-CM | POA: Diagnosis not present

## 2017-04-30 DIAGNOSIS — R2689 Other abnormalities of gait and mobility: Secondary | ICD-10-CM | POA: Diagnosis not present

## 2017-04-30 NOTE — Therapy (Signed)
[facility] [address] [place], [place], [address] Phone: [phone]   Fax:  [phone]  Speech Language Pathology Treatment  Patient Details  Name: Jocelyn Ramirez MRN: [id number] Date of Birth: Feb 21, 1929 Referring Provider: Dr. Gavin Pound   Encounter Date: 04/30/2017  End of Session - 04/30/17 1728    Visit Number  11    Number of Visits  17    Date for SLP Re-Evaluation  05/08/17    SLP Start Time  1320    SLP Stop Time   1400    SLP Time Calculation (min)  40 min    Activity Tolerance  Patient tolerated treatment well       Past Medical History:  Diagnosis Date  . COPD (chronic obstructive pulmonary disease) ([place])   . Lung cancer ([place])   . Peripheral neuropathy     Past Surgical History:  Procedure Laterality Date  . endometrial tumor    . perforated abcess on colon  1992  . RLL lobectomy  11/1992  . VAGINAL HYSTERECTOMY      There were no vitals filed for this visit.  Subjective Assessment - 04/30/17 1329    Subjective  "I've been talking a lot and so (my voice) sounds better."    Currently in Pain?  No/denies            ADULT SLP TREATMENT - 04/30/17 1332      General Information   Behavior/Cognition  Alert;Cooperative;Pleasant mood      Treatment Provided   Treatment provided  Cognitive-Linquistic      Cognitive-Linquistic Treatment   Treatment focused on  Voice    Skilled Treatment  Pt enters with "stronger voice" (WNL breath support for speech)/WNL quality 90% in mod complex conversation of 21 minutes. In subsequent conversation (15 minutes), pt req'd SLP cue x1 for increasing her abdominal base/strength for proper breath support. Pt has been reading in her "strong voice" daily, is also doing HEP on a daily basis. SLP told pt to cont with HEP at least x3/week for maintenance of improved vocal quality. Pt stated she would like to continue once a week or once bi-weekly to  "make sure I'm on the right track." SLP to discuss with pt next visit, 05-15-17 due to pt meeting LTGs 2 and 3.      Assessment / Recommendations / Plan   Plan  Continue with current plan of care      Progression Toward Goals   Progression toward goals  -- Likely d/c next session         SLP Short Term Goals - 04/16/17 1902      SLP SHORT TERM GOAL #1   Title  Pt will perfrom HEP for vocal fold adduction with occasional min A over 2 sessions    Status  Partially Met      SLP SHORT TERM GOAL #2   Title  Pt will demonstrate abdominal breathing in structured speech tasks 18/20 responses with occasional min A    Status  Achieved      SLP SHORT TERM GOAL #3   Title  Pt will report 25% reduction in dysphonia, subjectively, over 2 sessions    Status  Partially Met       SLP Long Term Goals - 04/30/17 1341      SLP LONG TERM GOAL #1   Title  Pt will perform HEP for dysphonia with rare min A over 3 sessions    Time  2    Period  Weeks    Status  On-going      SLP LONG TERM GOAL #2   Title  Pt will demonstrate abdominal breathing and adequate volume in simple conversation over 8 minutes with rare min A    Time  --    Period  --    Status  Achieved      SLP LONG TERM GOAL #3   Title  Pt will achieve WNL phonation over 15 minute conversation with rare min A    Status  Achieved      SLP LONG TERM GOAL #4   Title  pt will achieve WNL phonation in 20 minutes conversation in 2 ST sessions at least two weeks apart    Baseline  04-30-17    Time  3    Period  -- visits    Status  New       Plan - 04/30/17 1728    Clinical Impression Statement  Pt continues to complete HEP at home. Pt demo'd appropriate abdominal breathing and breath support, and WNL voicing in at least 80% in two conversations with SLP 21 mintues, and WNL breath support and >90% WNL voicing in conversation of 15 minutes with SLP. Pt is appropriate for d/c in next 1-2 visits to ensure consistency of skills gained.      Speech Therapy Frequency  2x / week    Treatment/Interventions  Cueing hierarchy;Multimodal communcation approach;Functional tasks;Patient/family education;Compensatory techniques;Environmental controls;Internal/external aids;SLP instruction and feedback    Potential to Achieve Goals  Fair    Potential Considerations  Severity of impairments    SLP Home Exercise Plan  HEP for vocal fold adduction - see pt instructions    Consulted and Agree with Plan of Care  Patient       Patient will benefit from skilled therapeutic intervention in order to improve the following deficits and impairments:   Other voice and resonance disorders    Problem List Patient Active Problem List   Diagnosis Date Noted  . Allergic rhinitis 01/01/2016  . Bronchiectasis without acute exacerbation ([place]) 11/03/2014  . COPD with emphysema ([place]) 11/03/2014  . Lung cancer ([place]) 11/03/2014  . Peripheral neuralgia 11/03/2014    [facility] ,[place], CCC-SLP  04/30/2017, 5:34 PM  [facility] [address] [place], [place], [address] Phone: [phone]   Fax:  [phone]   Name: Jocelyn Ramirez MRN: [id number] Date of Birth: 10-21-1928

## 2017-04-30 NOTE — Patient Instructions (Signed)
Do your exercises every day for your voice until 05-15-17, then do them three times a week.

## 2017-05-04 DIAGNOSIS — J383 Other diseases of vocal cords: Secondary | ICD-10-CM | POA: Diagnosis not present

## 2017-05-04 DIAGNOSIS — R42 Dizziness and giddiness: Secondary | ICD-10-CM | POA: Diagnosis not present

## 2017-05-04 DIAGNOSIS — R2681 Unsteadiness on feet: Secondary | ICD-10-CM | POA: Diagnosis not present

## 2017-05-12 ENCOUNTER — Encounter: Payer: Self-pay | Admitting: Physical Therapy

## 2017-05-12 ENCOUNTER — Ambulatory Visit: Payer: Medicare Other | Admitting: Physical Therapy

## 2017-05-12 DIAGNOSIS — R42 Dizziness and giddiness: Secondary | ICD-10-CM

## 2017-05-12 DIAGNOSIS — R498 Other voice and resonance disorders: Secondary | ICD-10-CM | POA: Diagnosis not present

## 2017-05-12 DIAGNOSIS — R2689 Other abnormalities of gait and mobility: Secondary | ICD-10-CM

## 2017-05-12 DIAGNOSIS — R2681 Unsteadiness on feet: Secondary | ICD-10-CM | POA: Diagnosis not present

## 2017-05-12 DIAGNOSIS — M6281 Muscle weakness (generalized): Secondary | ICD-10-CM | POA: Diagnosis not present

## 2017-05-12 NOTE — Patient Instructions (Signed)
  Demonstrated SLS with light UE support to no support for 10-15 seconds

## 2017-05-12 NOTE — Therapy (Signed)
[facility] [address] [place], [place], [address] Phone: [phone]   Fax:  [phone]  Physical Therapy Evaluation  Patient Details  Name: Jocelyn Ramirez MRN: [id number] Date of Birth: 02/04/1929 Referring Provider: Helayne Seminole, MD   Encounter Date: 05/12/2017  PT End of Session - 05/12/17 1937    Visit Number  1    Number of Visits  9    Date for PT Re-Evaluation  06/11/17    Authorization Type  Medicare; BCBS    Authorization Time Period  05/12/17  to  07/11/2017    Authorization - Visit Number  2 SLP + PT    Authorization - Number of Visits  75 PT, OT, SLP combined    PT Start Time  1146    PT Stop Time  1238    PT Time Calculation (min)  52 min    Activity Tolerance  Patient tolerated treatment well    Behavior During Therapy  [facility] for tasks assessed/performed       Past Medical History:  Diagnosis Date  . COPD (chronic obstructive pulmonary disease) ([place])   . Lung cancer ([place])   . Peripheral neuropathy     Past Surgical History:  Procedure Laterality Date  . endometrial tumor    . perforated abcess on colon  1992  . RLL lobectomy  11/1992  . VAGINAL HYSTERECTOMY      There were no vitals filed for this visit.   Subjective Assessment - 05/12/17 1150    Subjective  It's been a combination of dizziness and unsteadiness--both. I try to pay attention to the side effects of the medicines I take. I was taking Claritin and realized it was making me have vertigo. So I have stopped that. I've moved from a single tip cane to a SBQC (in November). Pt reports lightheadedness-type dizziness and no vertigo (currently).    Pertinent History  afib, OP, COPD, peripheral neuropathy (pins and needles; heavy legs), vocal fold mass, mesenteric mass (new, following)    Patient Stated Goals  I want to find out if I need this stick because of the mental side of things or if I really need it.     Currently in Pain?   Yes    Pain Location  Foot    Pain Orientation  Right;Left    Pain Descriptors / Indicators  Tingling         [facility] PT Assessment - 05/12/17 1159      Assessment   Medical Diagnosis  unsteady gait, dizziness    Referring Provider  Helayne Seminole, MD    Onset Date/Surgical Date  -- ongoing with incr symptoms Nov 2018    Prior Therapy  no PT      Precautions   Precautions  Fall      Restrictions   Weight Bearing Restrictions  No      Balance Screen   Has the patient fallen in the past 6 months  No    Has the patient had a decrease in activity level because of a fear of falling?   Yes    Is the patient reluctant to leave their home because of a fear of falling?   No      Home Social worker  Private residence    Living Arrangements  Alone    Available Help at Discharge  Friend(s)    Type of [place]  One  level    Home Equipment  Cane - quad;Cane - single point    Additional Comments  did not complete intake re: home equipment      Prior Function   Level of Independence  Independent with household mobility without device;Independent with community mobility with device    Vocation  Retired    Leisure  on board for the [employer]      Cognition   Overall Cognitive Status  Within Functional Limits for tasks assessed      Observation/Other Assessments   Observations  poor use of SBQC from lobby to exam room    Focus on Therapeutic Outcomes (FOTO)   NA due to no current vertigo per pt      Sensation   Light Touch  Impaired by gross assessment    Additional Comments  reports pins/needles from just below knees to her toes      Coordination   Gross Motor Movements are Fluid and Coordinated  Yes LEs      Posture/Postural Control   Posture/Postural Control  No significant limitations      ROM / Strength   AROM / PROM / Strength  AROM;Strength      AROM   Overall AROM   Within functional limits for tasks performed       Strength   Overall Strength  Deficits    Overall Strength Comments  generalized weakness noted with functional tasks assessed      Transfers   Transfers  Sit to Stand;Stand to Sit    Sit to Stand  6: Modified independent (Device/Increase time);With armrests    Stand to Sit  6: Modified independent (Device/Increase time);With armrests    Transfer Cueing  to/from [facility]      Ambulation/Gait   Ambulation/Gait  Yes    Ambulation/Gait Assistance  6: Modified independent (Device/Increase time)    Ambulation Distance (Feet)  80 Feet 40 x2; 75    Assistive device  Small based quad cane    Gait Pattern  Step-through pattern;Decreased stride length;Decreased weight shift to right;Decreased trunk rotation;Wide base of support    Ambulation Surface  Level;Indoor    Gait velocity  32.8/14.12=2.32 ft/sec safe community ambulation >2.62 ft/sec    Gait Comments  +dyspnea with ambulation x 50 ft (able to continue talking only if stopped walking); poor use of SBQC (out of sequence, only 2 feet of SBQC touching at a time)      Standardized Balance Assessment   Standardized Balance Assessment  Dynamic Gait Index;Timed Up and Go Test      Dynamic Gait Index   Level Surface  Mild Impairment    Change in Gait Speed  Moderate Impairment    Gait with Horizontal Head Turns  Moderate Impairment    Gait with Vertical Head Turns  Mild Impairment    Gait and Pivot Turn  Mild Impairment    Step Over Obstacle  Moderate Impairment    Step Around Obstacles  Mild Impairment    Steps  Moderate Impairment    Total Score  12      Timed Up and Go Test   Normal TUG (seconds)  18.1  >13.5 sec indicates high fall risk             Objective measurements completed on examination: See above findings.              PT Education - 05/12/17 1934    Education provided  Yes    Education Details  results of PT evaluation and PT POC; adjusted cane to proper height and educated on proper sequencing; educated on  rationale for cane with LE neuropathy and pro's/con's of SBQC vs SPC    Person(s) Educated  Patient    Methods  Explanation;Demonstration    Comprehension  Verbalized understanding;Returned demonstration          PT Long Term Goals - 05/12/17 1957      PT LONG TERM GOAL #1   Title  Patient will be independent with HEP to address decreased balance and general weakness. (Target all LTGs 06/11/17)    Time  4    Period  Weeks    Status  New    Target Date  06/11/17      PT LONG TERM GOAL #2   Title  Patient will verbalize a plan for continued community-based activity/exercise upon discharge from PT.    Time  4    Period  Weeks    Status  New      PT LONG TERM GOAL #3   Title  Patient will complete 6MWT with LRAD and goal TBA based on results.     Time  1    Period  Weeks    Status  New      PT LONG TERM GOAL #4   Title  Patient will improve DGI score >=15 to indicate a lesser fall risk.     Baseline  05/12/17 12/24    Time  4    Period  Weeks    Status  New      PT LONG TERM GOAL #5   Title  Patient will complete TUG in <=15 seconds to indicate lesser fall risk.     Baseline  05/12/17  18.10 sec    Time  4    Period  Weeks    Status  New             Plan - 05/12/17 1942    Clinical Impression Statement  Patient referred for PT evaluation due to unsteady gait with dizziness per PCP. Patient currently denies vertigo (has history of vertigo) and describes dizziness as a lightheadedness when she gets up in the morning. Reports steady decline in her ambulation with decision to switch to using [facility] in November (after an episode of vertigo). Patient's objective measures indicate she is at an increased risk of falling (TUG, DGI). Anticipate patient can benefit from a short course of PT to improve her safety via the interventions listed below.     History and Personal Factors relevant to plan of care:  PMH-afib, COPD, peripheral neuropathy, vocal fold mass (seeing SLP), newly  diagnosed mesenteric mass  Personal Factors-lives alone, age,     Clinical Presentation  Evolving    Clinical Presentation due to:  progressive nature of peripheral neuropathy    Clinical Decision Making  Moderate    Rehab Potential  Good    Clinical Impairments Affecting Rehab Potential  cardiopulmonary status, neuropathy    PT Frequency  2x / week    PT Duration  4 weeks    PT Treatment/Interventions  ADLs/Self Care Home Management;DME Instruction;Gait training;Stair training;Balance training;Therapeutic exercise;Therapeutic activities;Functional mobility training;Neuromuscular re-education;Patient/family education;Vestibular;Visual/perceptual remediation/compensation    PT Next Visit Plan  perform 6 MWT for baseline and set 4 week goal; begin HEP for balance (?OTAGO); try return to [place] [facility] and Agree with Plan of Care  Patient       Patient will benefit from skilled  therapeutic intervention in order to improve the following deficits and impairments:  Abnormal gait, Cardiopulmonary status limiting activity, Decreased balance, Decreased endurance, Decreased knowledge of use of DME, Decreased strength, Dizziness, Impaired sensation, Impaired vision/preception(using progressive lenses x 6 months "difficult to get used to" )  Visit Diagnosis: Muscle weakness (generalized) - Plan: PT plan of care cert/re-cert  Unsteadiness on feet - Plan: PT plan of care cert/re-cert  Other abnormalities of gait and mobility - Plan: PT plan of care cert/re-cert  Dizziness and giddiness - Plan: PT plan of care cert/re-cert     Problem List Patient Active Problem List   Diagnosis Date Noted  . Allergic rhinitis 01/01/2016  . Bronchiectasis without acute exacerbation ([place]) 11/03/2014  . COPD with emphysema ([place]) 11/03/2014  . Lung cancer ([place]) 11/03/2014  . Peripheral neuralgia 11/03/2014    Rexanne Mano, PT 05/12/2017, 8:06 PM  [facility] [address] [place], [place], [address] Phone: [phone]   Fax:  [phone]  Name: Cyndia Degraff MRN: [id number] Date of Birth: 20-Jul-1928

## 2017-05-18 ENCOUNTER — Ambulatory Visit: Payer: Medicare Other | Admitting: Physical Therapy

## 2017-05-18 ENCOUNTER — Encounter: Payer: Self-pay | Admitting: Physical Therapy

## 2017-05-18 DIAGNOSIS — M6281 Muscle weakness (generalized): Secondary | ICD-10-CM | POA: Diagnosis not present

## 2017-05-18 DIAGNOSIS — R2681 Unsteadiness on feet: Secondary | ICD-10-CM | POA: Diagnosis not present

## 2017-05-18 DIAGNOSIS — R42 Dizziness and giddiness: Secondary | ICD-10-CM | POA: Diagnosis not present

## 2017-05-18 DIAGNOSIS — R2689 Other abnormalities of gait and mobility: Secondary | ICD-10-CM

## 2017-05-18 DIAGNOSIS — R498 Other voice and resonance disorders: Secondary | ICD-10-CM | POA: Diagnosis not present

## 2017-05-18 NOTE — Therapy (Signed)
[facility] [address] [place], [place], [address] Phone: [phone]   Fax:  [phone]  Physical Therapy Treatment  Patient Details  Name: Jocelyn Ramirez MRN: [id number] Date of Birth: 18-Jul-1928 Referring Provider: Helayne Seminole, MD   Encounter Date: 05/18/2017  PT End of Session - 05/18/17 1637    Visit Number  2    Number of Visits  9    Date for PT Re-Evaluation  06/11/17    Authorization Type  Medicare; BCBS    Authorization Time Period  05/12/17  to  07/11/2017    Authorization - Visit Number  3 SLP + PT    Authorization - Number of Visits  75 PT, OT, SLP combined    PT Start Time  1535    PT Stop Time  1625    PT Time Calculation (min)  50 min    Activity Tolerance  Patient tolerated treatment well    Behavior During Therapy  [facility] for tasks assessed/performed       Past Medical History:  Diagnosis Date  . COPD (chronic obstructive pulmonary disease) ([place])   . Lung cancer ([place])   . Peripheral neuropathy     Past Surgical History:  Procedure Laterality Date  . endometrial tumor    . perforated abcess on colon  1992  . RLL lobectomy  11/1992  . VAGINAL HYSTERECTOMY      There were no vitals filed for this visit.  Subjective Assessment - 05/18/17 1630    Subjective  I've been doing those exercises and they are more difficult to to on my carpet. [place] Now does not feel safe, steady enough to walk "out in the open" like that     Pertinent History  afib, OP, COPD, peripheral neuropathy (pins and needles; heavy legs), vocal fold mass, mesenteric mass (new, following)    Patient Stated Goals  I want to find out if I need this stick because of the mental side of things or if I really need it.     Currently in Pain?  No/denies         [facility] PT Assessment - 05/18/17 1631      6 Minute Walk- Baseline   6 Minute Walk-  Baseline  yes    HR (bpm)  70    02 Sat (%RA)  95 %    Perceived Rate of Exertion (Borg)  7- Very, very light      6 Minute walk- Post Test   6 Minute Walk Post Test  yes    HR (bpm)  87    02 Sat (%RA)  87 % increased to 90 in 60 seconds (seated rest)    Modified Borg Scale for Dyspnea  2- Mild shortness of breath    Perceived Rate of Exertion (Borg)  7- Very, very light      6 minute walk test results    Aerobic Endurance Distance Walked  674    Endurance additional comments  norm for age 40 ft                  [facility] Adult PT Treatment/Exercise - 05/18/17 1631      Ambulation/Gait   Ambulation/Gait Assistance  6: Modified independent (Device/Increase time)    Assistive device  Small based quad cane    Gait Comments  see 6MWT; pt with better sequencing with SBQC during gait; continues  to slightly "roll" from back legs to front legs          Balance Exercises - 05/18/17 1632      OTAGO PROGRAM   Ankle Plantorflexors  20 reps, support    Ankle Dorsiflexors  20 reps, support    Backwards Walking  Support 40 ft at counter    Tandem Stance  10 seconds, support    One Leg Stand  10 seconds, support        PT Education - 05/18/17 1635    Education provided  Yes    Education Details  discussed goals; reason for 6 MWT; instruction for HEP; plan to start a walking program (5-6 minutes at slower pace, twice per day)    Person(s) Educated  Patient    Methods  Explanation;Demonstration;Handout    Comprehension  Verbalized understanding;Returned demonstration;Verbal cues required;Need further instruction          PT Long Term Goals - 05/18/17 1642      PT LONG TERM GOAL #1   Title  Patient will be independent with HEP to address decreased balance and general weakness. (Target all LTGs 06/11/17)    Time  4    Period  Weeks    Status  New      PT LONG TERM GOAL #2   Title  Patient will verbalize a plan for continued community-based activity/exercise upon  discharge from PT.    Time  4    Period  Weeks    Status  New      PT LONG TERM GOAL #3   Title  Patient will complete 6MWT with LRAD and goal TBA based on results.     Time  1    Period  Weeks    Status  Achieved      PT LONG TERM GOAL #4   Title  Patient will improve DGI score >=15 to indicate a lesser fall risk.     Baseline  05/12/17 12/24    Time  4    Period  Weeks    Status  New      PT LONG TERM GOAL #5   Title  Patient will complete TUG in <=15 seconds to indicate lesser fall risk.     Baseline  05/12/17  18.10 sec    Time  4    Period  Weeks    Status  New      Additional Long Term Goals   Additional Long Term Goals  Yes      PT LONG TERM GOAL #6   Title  Patient will increase her 6MWT distance by 150 ft with SaO2 >90% to demonstrate improved safety with return to exercise in the community.     Time  4    Period  Weeks    Status  New            Plan - 05/18/17 1638    Clinical Impression Statement  Session focused on assessment of exercise tolerance utilizing 6 MWT with pt ~300 ft below norm for her age and with SaO2 decreased to 87% with dyspnea. With seated rest, she recovered to 90% within 60 seconds. Initiated HEP for improving balance. Patient asking excellent questions and appears very motivated. Anticipate excellent progress.    Rehab Potential  Good    Clinical Impairments Affecting Rehab Potential  cardiopulmonary status, neuropathy    PT Frequency  2x / week    PT Duration  4 weeks    PT  Treatment/Interventions  ADLs/Self Care Home Management;DME Instruction;Gait training;Stair training;Balance training;Therapeutic exercise;Therapeutic activities;Functional mobility training;Neuromuscular re-education;Patient/family education;Vestibular;Visual/perceptual remediation/compensation    PT Next Visit Plan  continue [place] HEP for balance; ?try SPC (she owns one, but began using [facility] in November)    Consulted and Agree with Plan of Care  Patient        Patient will benefit from skilled therapeutic intervention in order to improve the following deficits and impairments:  Abnormal gait, Cardiopulmonary status limiting activity, Decreased balance, Decreased endurance, Decreased knowledge of use of DME, Decreased strength, Dizziness, Impaired sensation, Impaired vision/preception(using progressive lenses x 6 months "difficult to get used to" )  Visit Diagnosis: Unsteadiness on feet  Muscle weakness (generalized)  Other abnormalities of gait and mobility     Problem List Patient Active Problem List   Diagnosis Date Noted  . Allergic rhinitis 01/01/2016  . Bronchiectasis without acute exacerbation ([place]) 11/03/2014  . COPD with emphysema ([place]) 11/03/2014  . Lung cancer ([place]) 11/03/2014  . Peripheral neuralgia 11/03/2014    Rexanne Mano , PT 05/18/2017, 4:46 PM  [facility] [address] [place], [place], [address] Phone: [phone]   Fax:  [phone]  Name: Jocelyn Ramirez MRN: [id number] Date of Birth: December 25, 1928

## 2017-05-21 ENCOUNTER — Ambulatory Visit: Payer: Medicare Other | Admitting: Rehabilitation

## 2017-05-22 ENCOUNTER — Other Ambulatory Visit: Payer: Self-pay

## 2017-05-22 ENCOUNTER — Ambulatory Visit: Payer: Medicare Other

## 2017-05-22 ENCOUNTER — Ambulatory Visit: Payer: Medicare Other | Admitting: Rehabilitation

## 2017-05-22 ENCOUNTER — Encounter: Payer: Self-pay | Admitting: Rehabilitation

## 2017-05-22 DIAGNOSIS — R2681 Unsteadiness on feet: Secondary | ICD-10-CM

## 2017-05-22 DIAGNOSIS — R2689 Other abnormalities of gait and mobility: Secondary | ICD-10-CM

## 2017-05-22 DIAGNOSIS — M6281 Muscle weakness (generalized): Secondary | ICD-10-CM | POA: Diagnosis not present

## 2017-05-22 DIAGNOSIS — R42 Dizziness and giddiness: Secondary | ICD-10-CM

## 2017-05-22 DIAGNOSIS — R498 Other voice and resonance disorders: Secondary | ICD-10-CM | POA: Diagnosis not present

## 2017-05-22 NOTE — Therapy (Signed)
[facility] [address] [place], [place], [address] Phone: [phone]   Fax:  [phone]  Speech Language Pathology Treatment  Patient Details  Name: Jocelyn Ramirez MRN: [id number] Date of Birth: [birth year] Referring Provider: Dr. Gavin Pound   Encounter Date: 05/22/2017  End of Session - 05/22/17 1647    Visit Number  12    Number of Visits  17    Date for SLP Re-Evaluation  06/12/17    SLP Start Time  1533    SLP Stop Time   1615    SLP Time Calculation (min)  42 min    Activity Tolerance  Patient tolerated treatment well       Past Medical History:  Diagnosis Date  . COPD (chronic obstructive pulmonary disease) ([place])   . Lung cancer ([place])   . Peripheral neuropathy     Past Surgical History:  Procedure Laterality Date  . endometrial tumor    . perforated abcess on colon  1992  . RLL lobectomy  11/1992  . VAGINAL HYSTERECTOMY      There were no vitals filed for this visit.  Subjective Assessment - 05/22/17 1543    Subjective  "IT's been dreadful this week. It's ben so cold."    Currently in Pain?  No/denies            ADULT SLP TREATMENT - 05/22/17 1543      General Information   Behavior/Cognition  Alert;Cooperative;Pleasant mood      Treatment Provided   Treatment provided  Cognitive-Linquistic      Cognitive-Linquistic Treatment   Treatment focused on  Voice    Skilled Treatment  Pt reports she has not done any HEP after 05-12-17. Her voice (subjectively) sounds slightly more raspy and dry than last session. SLP strongly suggested that pt cont with HEP after d/c x3/week. Pt will be seen for one more session in two weeks to see if pt improvement in exercise frequency plays a role in improved voice quality. Today, SLP targeted appropriate breath support and had pt practice 8 loud "HEY!" to feel abdominal push. After this, pt able to speak with her "strong voice" for 12 mintues while  walking around in gym.       Assessment / Recommendations / Plan   Plan  -- one more session in two weeks      Progression Toward Goals   Progression toward goals  -- d/c next session       SLP Education - 05/22/17 1647    Education provided  Yes    Education Details  need to complete HEP x3/week after d/c, likely decr in vocal quality last 3 weeks due to did not complete HEP -lost muscle memory    Person(s) Educated  Patient    Methods  Explanation    Comprehension  Verbalized understanding       SLP Short Term Goals - 04/16/17 1902      SLP SHORT TERM GOAL #1   Title  Pt will perfrom HEP for vocal fold adduction with occasional min A over 2 sessions    Status  Partially Met      SLP SHORT TERM GOAL #2   Title  Pt will demonstrate abdominal breathing in structured speech tasks 18/20 responses with occasional min A    Status  Achieved      SLP SHORT TERM GOAL #3   Title  Pt will report 25% reduction in dysphonia, subjectively, over 2 sessions  Status  Partially Met       SLP Long Term Goals - 05/22/17 1651      SLP LONG TERM GOAL #1   Title  Pt will perform HEP for dysphonia with rare min A    Time  2    Period  Weeks    Status  Revised      SLP LONG TERM GOAL #2   Title  Pt will demonstrate abdominal breathing and adequate volume in simple conversation over 8 minutes with rare min A    Status  Achieved      SLP LONG TERM GOAL #3   Title  Pt will achieve WNL phonation over 15 minute conversation with rare min A    Status  Achieved      SLP LONG TERM GOAL #4   Title  pt will achieve WNL phonation in 20 minutes conversation in 2 ST sessions at least two weeks apart    Baseline  04-30-17    Time  2    Period  -- visits    Status  On-going       Plan - 05/22/17 1648    Clinical Impression Statement  Pt did not complete HEP at home the last two weeks, arrived with raspy/dry sounding voice. SLP told pt she likely lost muscle memory for strong speech with  abdominal musculature due to noncompliance with HEP. Pt educated she needs to cont to perform HEP untli next session at least 6 days/week, adn then after d/c at x3/week for maintenance. Pt demo'd appropriate abdominal breathing and breath support, however, and with 8 reps of loud "hey", spoke wiht SLP walking in clinic for 12 minutes with consistent strong voice. Pt agreed with this. Pt is appropriate for one more visit targeting LTG 1 and 4, to ensure consistency of skills gained, while engaging in HEP as prescribed. She is in agreement of d/c next session.    Speech Therapy Frequency  2x / week    Duration  -- 2 more sessions (including today)    Treatment/Interventions  Cueing hierarchy;Multimodal communcation approach;Functional tasks;Patient/family education;Compensatory techniques;Environmental controls;Internal/external aids;SLP instruction and feedback    Potential to Achieve Goals  Fair    Potential Considerations  Severity of impairments    SLP Home Exercise Plan  HEP for vocal fold adduction - see pt instructions    Consulted and Agree with Plan of Care  Patient       Patient will benefit from skilled therapeutic intervention in order to improve the following deficits and impairments:   Other voice and resonance disorders    Problem List Patient Active Problem List   Diagnosis Date Noted  . Allergic rhinitis 01/01/2016  . Bronchiectasis without acute exacerbation ([place]) 11/03/2014  . COPD with emphysema ([place]) 11/03/2014  . Lung cancer ([place]) 11/03/2014  . Peripheral neuralgia 11/03/2014    [facility] ,MS, CCC-SLP  05/22/2017, 4:53 PM  [facility] [address] [place], [place], [address] Phone: [phone]   Fax:  [phone]   Name: Jocelyn Ramirez MRN: [id number] Date of Birth: [birth year]

## 2017-05-22 NOTE — Patient Instructions (Signed)
Stand in corner with a loose chair in front of you for support.      Feet Apart (Compliant Surface) Arm Motion - Eyes Closed    Stand on compliant surface: ___pillow_____, feet shoulder width apart. Close eyes and keep arms by your side.   Repeat __3__ times per session for 20 secs each . Do __2__ sessions per day.  Copyright  VHI. All rights reserved.   Feet Together (Compliant Surface) Head Motion - Eyes Open    With eyes open, standing on compliant surface: __pillow______, feet together, move head slowly: up and down x 10 reps and side to side x 10 reps.  Start with your feet slightly apart and as you improve, you can move feet together.   Repeat __1__ times per session. Do __2__ sessions per day.  Copyright  VHI. All rights reserved.

## 2017-05-22 NOTE — Patient Instructions (Signed)
   Complete voice exercises as prescribed, 6 days a week  Talk to people outside your home, with a strong voice - feel your abdominal muscles working  Read articles online with a strong voice - abdominal muscles should be working

## 2017-05-23 NOTE — Therapy (Signed)
[facility] [address] [place], [place], [address] Phone: [phone]   Fax:  [phone]  Physical Therapy Treatment  Patient Details  Name: Jocelyn Ramirez MRN: [id number] Date of Birth: [birth year] Referring Provider: Helayne Seminole, MD   Encounter Date: 05/22/2017  PT End of Session - 05/23/17 0714    Visit Number  3    Number of Visits  9    Date for PT Re-Evaluation  06/11/17    Authorization Type  Medicare; BCBS    Authorization Time Period  05/12/17  to  07/11/2017    Authorization - Visit Number  5 SLP + PT    Authorization - Number of Visits  75 PT, OT, SLP combined    PT Start Time  1616    PT Stop Time  1657    PT Time Calculation (min)  41 min    Activity Tolerance  Patient tolerated treatment well    Behavior During Therapy  [facility] for tasks assessed/performed       Past Medical History:  Diagnosis Date  . COPD (chronic obstructive pulmonary disease) ([place])   . Lung cancer ([place])   . Peripheral neuropathy     Past Surgical History:  Procedure Laterality Date  . endometrial tumor    . perforated abcess on colon  1992  . RLL lobectomy  11/1992  . VAGINAL HYSTERECTOMY      There were no vitals filed for this visit.  Subjective Assessment - 05/22/17 1640    Subjective  Reports no changes, has continued to do exercises and is present with them today.  States that her goal is walk with single point cane.     Pertinent History  afib, OP, COPD, peripheral neuropathy (pins and needles; heavy legs), vocal fold mass, mesenteric mass (new, following)    Patient Stated Goals  I want to find out if I need this stick because of the mental side of things or if I really need it.     Currently in Pain?  No/denies                      [facility] Adult PT Treatment/Exercise - 05/22/17 1620      Neuro Re-ed    Neuro Re-ed Details   Educated pt on balance systems and importance of vestibular system  esp with recent bout of vertigo.  Added two corner balance exercises to do this.  See pt instruction for details on exercises and reps.            Balance Exercises - 05/22/17 1620      OTAGO PROGRAM   Tandem Stance  10 seconds, support    Tandem Walk  Support    One Leg Stand  10 seconds, support    Heel Walking  Support    Toe Walk  Support    Sit to Stand  10 reps, bilateral support    Overall OTAGO Comments  Pt did not need to use counter and cane today during tandem stance, to increase challenge, recommend she only use counter.          PT Education - 05/23/17 [phone]    Education provided  Yes    Education Details  vestibular deficits    Person(s) Educated  Patient    Methods  Explanation    Comprehension  Verbalized understanding          PT Long Term Goals - 05/18/17 1642  PT LONG TERM GOAL #1   Title  Patient will be independent with HEP to address decreased balance and general weakness. (Target all LTGs 06/11/17)    Time  4    Period  Weeks    Status  New      PT LONG TERM GOAL #2   Title  Patient will verbalize a plan for continued community-based activity/exercise upon discharge from PT.    Time  4    Period  Weeks    Status  New      PT LONG TERM GOAL #3   Title  Patient will complete 6MWT with LRAD and goal TBA based on results.     Time  1    Period  Weeks    Status  Achieved      PT LONG TERM GOAL #4   Title  Patient will improve DGI score >=15 to indicate a lesser fall risk.     Baseline  05/12/17 12/24    Time  4    Period  Weeks    Status  New      PT LONG TERM GOAL #5   Title  Patient will complete TUG in <=15 seconds to indicate lesser fall risk.     Baseline  05/12/17  18.10 sec    Time  4    Period  Weeks    Status  New      Additional Long Term Goals   Additional Long Term Goals  Yes      PT LONG TERM GOAL #6   Title  Patient will increase her 6MWT distance by 150 ft with SaO2 >90% to demonstrate improved safety with return  to exercise in the community.     Time  4    Period  Weeks    Status  New            Plan - 05/23/17 0715    Clinical Impression Statement  Session continued with OTAGO HEP, going over some exercises from last visit and adding several exercises.  Educated on impact of neuropathy on balance along with vestibular deficits.  Added corner balance exercises to HEP.      Rehab Potential  Good    Clinical Impairments Affecting Rehab Potential  cardiopulmonary status, neuropathy    PT Frequency  2x / week    PT Duration  4 weeks    PT Treatment/Interventions  ADLs/Self Care Home Management;DME Instruction;Gait training;Stair training;Balance training;Therapeutic exercise;Therapeutic activities;Functional mobility training;Neuromuscular re-education;Patient/family education;Vestibular;Visual/perceptual remediation/compensation    PT Next Visit Plan  continue [place] for balance (corner balance exercises can be updated as needed) ; ?try SPC (she owns one, but began using [facility] in November)    Consulted and Agree with Plan of Care  Patient       Patient will benefit from skilled therapeutic intervention in order to improve the following deficits and impairments:  Abnormal gait, Cardiopulmonary status limiting activity, Decreased balance, Decreased endurance, Decreased knowledge of use of DME, Decreased strength, Dizziness, Impaired sensation, Impaired vision/preception(using progressive lenses x 6 months "difficult to get used to" )  Visit Diagnosis: Unsteadiness on feet  Muscle weakness (generalized)  Other abnormalities of gait and mobility  Dizziness and giddiness     Problem List Patient Active Problem List   Diagnosis Date Noted  . Allergic rhinitis 01/01/2016  . Bronchiectasis without acute exacerbation ([place]) 11/03/2014  . COPD with emphysema ([place]) 11/03/2014  . Lung cancer ([place]) 11/03/2014  . Peripheral neuralgia 11/03/2014  Cameron Sprang, PT, MPT [facility] [address] [place], [place], [address] Phone: [phone]   Fax:  [phone] 05/23/17, 7:19 AM  Name: Jocelyn Ramirez MRN: [id number] Date of Birth: [birth year]

## 2017-05-26 ENCOUNTER — Ambulatory Visit: Payer: Medicare Other | Admitting: Physical Therapy

## 2017-05-27 ENCOUNTER — Ambulatory Visit: Payer: Medicare Other | Admitting: Physical Therapy

## 2017-05-27 ENCOUNTER — Encounter: Payer: Self-pay | Admitting: Physical Therapy

## 2017-05-27 DIAGNOSIS — M6281 Muscle weakness (generalized): Secondary | ICD-10-CM

## 2017-05-27 DIAGNOSIS — R2681 Unsteadiness on feet: Secondary | ICD-10-CM | POA: Diagnosis not present

## 2017-05-27 DIAGNOSIS — R2689 Other abnormalities of gait and mobility: Secondary | ICD-10-CM | POA: Diagnosis not present

## 2017-05-27 DIAGNOSIS — R42 Dizziness and giddiness: Secondary | ICD-10-CM

## 2017-05-27 DIAGNOSIS — R498 Other voice and resonance disorders: Secondary | ICD-10-CM | POA: Diagnosis not present

## 2017-05-27 NOTE — Patient Instructions (Signed)
Gaze Stabilization: Sitting    Keeping eyes on target on wall 3-5 feet away, and move head side to side for _30___ seconds. Repeat while moving head up and down for __30__ seconds. Do __2-3__ sessions per day.  Copyright  VHI. All rights reserved.   Gaze Stabilization: Tip Card  1.Target must remain in focus, not blurry, and appear stationary while head is in motion. 2.Perform exercises with small head movements (45 to either side of midline). 3.Increase speed of head motion so long as target is in focus. 4.If you wear eyeglasses, be sure you can see target through lens (therapist will give specific instructions for bifocal / progressive lenses). 5.These exercises may provoke dizziness or nausea. Work through these symptoms. If too dizzy, slow head movement slightly. Rest between each exercise. 6.Exercises demand concentration; avoid distractions.

## 2017-05-27 NOTE — Therapy (Signed)
[facility] [address] [place], [place], [address] Phone: [phone]   Fax:  [phone]  Physical Therapy Treatment  Patient Details  Name: Jocelyn Ramirez MRN: [id number] Date of Birth: 05-Sep-1928 Referring Provider: Helayne Seminole, MD   Encounter Date: 05/27/2017  PT End of Session - 05/27/17 1249    Visit Number  4    Number of Visits  9    Date for PT Re-Evaluation  06/11/17    Authorization Type  Medicare; BCBS    Authorization Time Period  05/12/17  to  07/11/2017    Authorization - Visit Number  4 SLP + PT    Authorization - Number of Visits  75 PT, OT, SLP combined    PT Start Time  1155    PT Stop Time  1235    PT Time Calculation (min)  40 min    Activity Tolerance  Patient tolerated treatment well    Behavior During Therapy  [facility] for tasks assessed/performed       Past Medical History:  Diagnosis Date  . COPD (chronic obstructive pulmonary disease) ([place])   . Lung cancer ([place])   . Peripheral neuropathy     Past Surgical History:  Procedure Laterality Date  . endometrial tumor    . perforated abcess on colon  1992  . RLL lobectomy  11/1992  . VAGINAL HYSTERECTOMY      There were no vitals filed for this visit.  Subjective Assessment - 05/27/17 1202    Subjective  Reports she wasn't able to do the exercises this weekend because of mild dizziness.  Asking a lot of questions about PN and vertigo and prognosis of improvement.    Pertinent History  afib, OP, COPD, peripheral neuropathy (pins and needles; heavy legs), vocal fold mass, mesenteric mass (new, following)    Patient Stated Goals  I want to find out if I need this stick because of the mental side of things or if I really need it.     Currently in Pain?  No/denies             Vestibular Assessment - 05/27/17 1205      Vestibular Assessment   General Observation  Pt reporting sudden episode of dizziness, was able to get to her  bedroom to the bed.  Neighbor helped her to the doctor who checked her ears and referred pt to ENT who referred pt PT.  Denies nausea/vomiting, denies falls.  Denies changes in hearing and vision.  Denies headaches.  Pt does have h/o tinnitus.  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      Symptom Behavior   Type of Dizziness  "World moves"    Frequency of Dizziness  comes on gradually in the morning and then continuous throughout the day    Duration of Dizziness  lasts all day    Aggravating Factors  Mornings;Turning head quickly    Relieving Factors  Comments resolves spontaneously      Occulomotor Exam   Occulomotor Alignment  Abnormal    Spontaneous  Absent    Gaze-induced  Absent    Smooth Pursuits  Intact    Saccades  Intact    Comment  Convergence not tested due to monocular vision      Vestibulo-Occular Reflex   VOR to Slow Head Movement  Normal    VOR Cancellation  Normal    Comment  HIT: delayed refixation saccades  bilaterally      Positional Testing   Dix-Hallpike  Dix-Hallpike Right;Dix-Hallpike Left    Horizontal Canal Testing  Horizontal Canal Right;Horizontal Canal Left      Dix-Hallpike Right   Dix-Hallpike Right Duration  0    Dix-Hallpike Right Symptoms  No nystagmus      Dix-Hallpike Left   Dix-Hallpike Left Duration  0    Dix-Hallpike Left Symptoms  No nystagmus      Horizontal Canal Right   Horizontal Canal Right Duration  0    Horizontal Canal Right Symptoms  Normal      Horizontal Canal Left   Horizontal Canal Left Duration  0    Horizontal Canal Left Symptoms  Normal               Vestibular Treatment/Exercise - 05/27/17 1224      Vestibular Treatment/Exercise   Vestibular Treatment Provided  Gaze    Gaze Exercises  X1 Viewing Horizontal;X1 Viewing Vertical      X1 Viewing Horizontal   Foot Position  seated    Reps  1    Comments  30 seconds      X1 Viewing Vertical   Foot Position  seated    Reps  1     Comments  30 seconds            PT Education - 05/27/17 1233    Education provided  Yes    Education Details  hypofunction and gaze adaptation exercises    Person(s) Educated  Patient    Methods  Explanation;Demonstration;Handout    Comprehension  Verbalized understanding;Returned demonstration          PT Long Term Goals - 05/18/17 1642      PT LONG TERM GOAL #1   Title  Patient will be independent with HEP to address decreased balance and general weakness. (Target all LTGs 06/11/17)    Time  4    Period  Weeks    Status  New      PT LONG TERM GOAL #2   Title  Patient will verbalize a plan for continued community-based activity/exercise upon discharge from PT.    Time  4    Period  Weeks    Status  New      PT LONG TERM GOAL #3   Title  Patient will complete 6MWT with LRAD and goal TBA based on results.     Time  1    Period  Weeks    Status  Achieved      PT LONG TERM GOAL #4   Title  Patient will improve DGI score >=15 to indicate a lesser fall risk.     Baseline  05/12/17 12/24    Time  4    Period  Weeks    Status  New      PT LONG TERM GOAL #5   Title  Patient will complete TUG in <=15 seconds to indicate lesser fall risk.     Baseline  05/12/17  18.10 sec    Time  4    Period  Weeks    Status  New      Additional Long Term Goals   Additional Long Term Goals  Yes      PT LONG TERM GOAL #6   Title  Patient will increase her 6MWT distance by 150 ft with SaO2 >90% to demonstrate improved safety with return to exercise in the community.     Time  4  Period  Weeks    Status  New            Plan - 05/27/17 1249    Clinical Impression Statement  Treatment session today focused on more in depth vestibular assessment.  Pt was negative for positional vertigo but did demonstrate delayed VOR bilaterally indicating overall vestibular hypofunction.  Pt also demonstrates balance impairments related to impaired sensation/peripheral neuropathy and  impaired vision (monocular).  Provided pt with gaze adaptation exercises in sitting; pt will benefit from mult-sensory approach to balance training.    Rehab Potential  Good    Clinical Impairments Affecting Rehab Potential  cardiopulmonary status, neuropathy    PT Frequency  2x / week    PT Duration  4 weeks    PT Treatment/Interventions  ADLs/Self Care Home Management;DME Instruction;Gait training;Stair training;Balance training;Therapeutic exercise;Therapeutic activities;Functional mobility training;Neuromuscular re-education;Patient/family education;Vestibular;Visual/perceptual remediation/compensation    PT Next Visit Plan  progress x 1 viewing; corner balance exercises for multi-sensory challenges.  continue [place] HEP for balance (corner balance exercises can be updated as needed) ; ?try SPC (she owns one, but began using [facility] in November)    Consulted and Agree with Plan of Care  Patient       Patient will benefit from skilled therapeutic intervention in order to improve the following deficits and impairments:  Abnormal gait, Cardiopulmonary status limiting activity, Decreased balance, Decreased endurance, Decreased knowledge of use of DME, Decreased strength, Dizziness, Impaired sensation, Impaired vision/preception(using progressive lenses x 6 months "difficult to get used to" )  Visit Diagnosis: Unsteadiness on feet  Muscle weakness (generalized)  Other abnormalities of gait and mobility  Dizziness and giddiness     Problem List Patient Active Problem List   Diagnosis Date Noted  . Allergic rhinitis 01/01/2016  . Bronchiectasis without acute exacerbation ([place]) 11/03/2014  . COPD with emphysema ([place]) 11/03/2014  . Lung cancer ([place]) 11/03/2014  . Peripheral neuralgia 11/03/2014   Rico Junker, PT, DPT 05/27/17    12:54 PM    [facility] [address] [place], [place], [address] Phone: [phone]   Fax:   [phone]  Name: Carl Bleecker MRN: [id number] Date of Birth: Apr 14, 1929

## 2017-05-29 ENCOUNTER — Ambulatory Visit: Payer: Medicare Other | Attending: Otolaryngology | Admitting: Rehabilitation

## 2017-05-29 ENCOUNTER — Encounter: Payer: Self-pay | Admitting: Rehabilitation

## 2017-05-29 DIAGNOSIS — R2681 Unsteadiness on feet: Secondary | ICD-10-CM | POA: Diagnosis not present

## 2017-05-29 DIAGNOSIS — R2689 Other abnormalities of gait and mobility: Secondary | ICD-10-CM | POA: Diagnosis not present

## 2017-05-29 DIAGNOSIS — R42 Dizziness and giddiness: Secondary | ICD-10-CM | POA: Diagnosis not present

## 2017-05-29 DIAGNOSIS — R498 Other voice and resonance disorders: Secondary | ICD-10-CM | POA: Insufficient documentation

## 2017-05-29 DIAGNOSIS — M6281 Muscle weakness (generalized): Secondary | ICD-10-CM | POA: Insufficient documentation

## 2017-05-29 NOTE — Therapy (Signed)
[facility] [address] [place], [place], [address] Phone: [phone]   Fax:  [phone]  Physical Therapy Treatment  Patient Details  Name: Jocelyn Ramirez MRN: [id number] Date of Birth: 02-18-1929 Referring Provider: Helayne Seminole, MD   Encounter Date: 05/29/2017  PT End of Session - 05/29/17 1545    Visit Number  5    Number of Visits  9    Date for PT Re-Evaluation  06/11/17    Authorization Type  Medicare; BCBS    Authorization Time Period  05/12/17  to  07/11/2017    Authorization - Visit Number  5 SLP + PT    Authorization - Number of Visits  75 PT, OT, SLP combined    PT Start Time  1316    PT Stop Time  1402    PT Time Calculation (min)  46 min    Activity Tolerance  Patient tolerated treatment well    Behavior During Therapy  [facility] for tasks assessed/performed       Past Medical History:  Diagnosis Date  . COPD (chronic obstructive pulmonary disease) ([place])   . Lung cancer ([place])   . Peripheral neuropathy     Past Surgical History:  Procedure Laterality Date  . endometrial tumor    . perforated abcess on colon  1992  . RLL lobectomy  11/1992  . VAGINAL HYSTERECTOMY      There were no vitals filed for this visit.  Subjective Assessment - 05/29/17 1354    Subjective  Pt reports feeling "off" slightly with exercises yesterday, but okay this morning.     Pertinent History  afib, OP, COPD, peripheral neuropathy (pins and needles; heavy legs), vocal fold mass, mesenteric mass (new, following)    Patient Stated Goals  I want to find out if I need this stick because of the mental side of things or if I really need it.     Currently in Pain?  No/denies                      [facility] Adult PT Treatment/Exercise - 05/29/17 0001      Ambulation/Gait   Ambulation/Gait  Yes    Ambulation/Gait Assistance  5: Supervision;4: Min guard    Ambulation/Gait Assistance Details  Assessed gait with  SPC vs quad tip cane during todays session to assess if she is safe to use at home.  Performed 250' with SPC with min/guard A with cues for sequencing and upright posture.  Then assessed gait with use of quad tip cane x 250'.  Pt able to ambulate at S to min/guard level.  Both devices she progressed to S level with marked improvement in gait fluidity and speed vs quad cane that she currently uses.  Recommend she use SPC in home to avoid furniture walking and improve balance and confidence with SPC.  Pt verbalized understanding.     Ambulation Distance (Feet)  -- see above    Assistive device  Straight cane and quad tip cane    Gait Pattern  Step-through pattern;Decreased stride length;Decreased weight shift to right;Decreased trunk rotation;Wide base of support    Ambulation Surface  Level;Indoor      Neuro Re-ed    Neuro Re-ed Details   Performed seated VOR exercises from last session to ensure proper technique.  Pt able to perform today without difficulty.  Reminded to perform on blank background to begin with.  Performed x 1 viewing with vertical and  horiztonal head turns x 30 secs each.  Performed corner balance exercises from HEP as well; compliant surface feet apart EC x 2 sets of 30 secs, feet apart EO w/ head turns up/down and side/side x 10 reps each.  Still note moderate difficulty, therefore educated to maintain these exercises.  Progressed to // bars standing on foam balance beam (feet perpendicular to balance beam) maintaining balance x 2 reps of 20 secs, maintaining balance with horizontal head turns (feet apart on both) x 2 sets of 20 secs.  Maintaining one LE in stance on foam beam advancing and retro stepping with opposite LE with cues for light UE support.  Pt needing continued cues for light UE support as she tends to hold tightly with single UE.  Performed x 10 reps on each side.  Standing on foam airex performing marching with BUE support>single UE support>holding therapist arms with cues  for light support x 20 reps.               PT Education - 05/29/17 1544    Education provided  Yes    Education Details  Continue to educate on hypofunction and purpose of gaze stabilization and vestibular exercises.     Person(s) Educated  Patient    Methods  Explanation    Comprehension  Verbalized understanding          PT Long Term Goals - 05/18/17 1642      PT LONG TERM GOAL #1   Title  Patient will be independent with HEP to address decreased balance and general weakness. (Target all LTGs 06/11/17)    Time  4    Period  Weeks    Status  New      PT LONG TERM GOAL #2   Title  Patient will verbalize a plan for continued community-based activity/exercise upon discharge from PT.    Time  4    Period  Weeks    Status  New      PT LONG TERM GOAL #3   Title  Patient will complete 6MWT with LRAD and goal TBA based on results.     Time  1    Period  Weeks    Status  Achieved      PT LONG TERM GOAL #4   Title  Patient will improve DGI score >=15 to indicate a lesser fall risk.     Baseline  05/12/17 12/24    Time  4    Period  Weeks    Status  New      PT LONG TERM GOAL #5   Title  Patient will complete TUG in <=15 seconds to indicate lesser fall risk.     Baseline  05/12/17  18.10 sec    Time  4    Period  Weeks    Status  New      Additional Long Term Goals   Additional Long Term Goals  Yes      PT LONG TERM GOAL #6   Title  Patient will increase her 6MWT distance by 150 ft with SaO2 >90% to demonstrate improved safety with return to exercise in the community.     Time  4    Period  Weeks    Status  New            Plan - 05/29/17 1546    Clinical Impression Statement  Skilled session went over gaze stabilization and vestibular exercises to address vestibular hypofunction.  Also assessed gait  with use of SPC vs quad tip cane both of which she had very fluid gait pattern and improved gait speed.  Recommend that she use SPC at home for improved  confidence and safety vs furniture walking.      Rehab Potential  Good    Clinical Impairments Affecting Rehab Potential  cardiopulmonary status, neuropathy    PT Frequency  2x / week    PT Duration  4 weeks    PT Treatment/Interventions  ADLs/Self Care Home Management;DME Instruction;Gait training;Stair training;Balance training;Therapeutic exercise;Therapeutic activities;Functional mobility training;Neuromuscular re-education;Patient/family education;Vestibular;Visual/perceptual remediation/compensation    PT Next Visit Plan  progress x 1 viewing; corner balance exercises for multi-sensory challenges.  continue [place] HEP for balance (corner balance exercises can be updated as needed) ;Continue gait with SPC (work towards outdoors as able)    Oncologist with Plan of Care  Patient       Patient will benefit from skilled therapeutic intervention in order to improve the following deficits and impairments:  Abnormal gait, Cardiopulmonary status limiting activity, Decreased balance, Decreased endurance, Decreased knowledge of use of DME, Decreased strength, Dizziness, Impaired sensation, Impaired vision/preception(using progressive lenses x 6 months "difficult to get used to" )  Visit Diagnosis: Unsteadiness on feet  Muscle weakness (generalized)  Other abnormalities of gait and mobility  Dizziness and giddiness     Problem List Patient Active Problem List   Diagnosis Date Noted  . Allergic rhinitis 01/01/2016  . Bronchiectasis without acute exacerbation ([place]) 11/03/2014  . COPD with emphysema ([place]) 11/03/2014  . Lung cancer ([place]) 11/03/2014  . Peripheral neuralgia 11/03/2014    Cameron Sprang, PT, MPT [facility] [address] [place], [place], [address] Phone: [phone]   Fax:  [phone] 05/29/17, 3:54 PM  Name: Jocelyn Ramirez MRN: [id number] Date of Birth: 09-Dec-1928

## 2017-06-01 ENCOUNTER — Encounter: Payer: Self-pay | Admitting: Physical Therapy

## 2017-06-01 ENCOUNTER — Ambulatory Visit: Payer: Medicare Other | Admitting: Physical Therapy

## 2017-06-01 DIAGNOSIS — R2681 Unsteadiness on feet: Secondary | ICD-10-CM | POA: Diagnosis not present

## 2017-06-01 DIAGNOSIS — R2689 Other abnormalities of gait and mobility: Secondary | ICD-10-CM

## 2017-06-01 DIAGNOSIS — M6281 Muscle weakness (generalized): Secondary | ICD-10-CM | POA: Diagnosis not present

## 2017-06-01 DIAGNOSIS — R42 Dizziness and giddiness: Secondary | ICD-10-CM | POA: Diagnosis not present

## 2017-06-01 DIAGNOSIS — R498 Other voice and resonance disorders: Secondary | ICD-10-CM | POA: Diagnosis not present

## 2017-06-01 NOTE — Therapy (Signed)
[facility] [address] [place], [place], [address] Phone: [phone]   Fax:  [phone]  Physical Therapy Treatment  Patient Details  Name: Jocelyn Ramirez MRN: [id number] Date of Birth: 1928/11/14 Referring Provider: Helayne Seminole, MD   Encounter Date: 06/01/2017  PT End of Session - 06/01/17 2024    Visit Number  6    Number of Visits  9    Date for PT Re-Evaluation  06/11/17    Authorization Type  Medicare; BCBS    Authorization Time Period  05/12/17  to  07/11/2017    Authorization - Visit Number  8 SLP and PT visits both count    Authorization - Number of Visits  75 PT, OT, SLP combined    PT Start Time  1148    PT Stop Time  1240    PT Time Calculation (min)  52 min    Activity Tolerance  Patient tolerated treatment well    Behavior During Therapy  [facility] for tasks assessed/performed       Past Medical History:  Diagnosis Date  . COPD (chronic obstructive pulmonary disease) ([place])   . Lung cancer ([place])   . Peripheral neuropathy     Past Surgical History:  Procedure Laterality Date  . endometrial tumor    . perforated abcess on colon  1992  . RLL lobectomy  11/1992  . VAGINAL HYSTERECTOMY      There were no vitals filed for this visit.  Subjective Assessment - 06/01/17 1153    Subjective  Reports lots of family over the weekend and didn't get to do exercises as much as usual.   (Pended)     Pertinent History  afib, OP, COPD, peripheral neuropathy (pins and needles; heavy legs), vocal fold mass, mesenteric mass (new, following)  (Pended)     Patient Stated Goals  I want to find out if I need this stick because of the mental side of things or if I really need it.   (Pended)     Currently in Pain?  No/denies  (Pended)                       [facility] Adult PT Treatment/Exercise - 06/01/17 2015      Transfers   Transfers  Sit to Stand;Stand to Sit    Sit to Stand  6: Modified independent  (Device/Increase time);5: Supervision    Sit to Stand Details (indicate cue type and reason)  with SBQC modified independent; with [facility] supervision with no imbalance     Stand to Sit  6: Modified independent (Device/Increase time)      Ambulation/Gait   Ambulation/Gait Assistance  4: Min guard;6: Modified independent (Device/Increase time)    Ambulation/Gait Assistance Details  SBQC modified independent; SPC pt is hesitant and wider BOS until she begins to feel more comfortable;     Ambulation Distance (Feet)  100 Feet 120, 100    Assistive device  Straight cane;Small based quad cane    Gait Pattern  Step-through pattern;Decreased stride length;Decreased weight shift to right;Decreased trunk rotation;Wide base of support    Ambulation Surface  Indoor      Exercises   Exercises  Knee/Hip      Knee/Hip Exercises: Standing   Hip Abduction  Stengthening;Both;1 set;20 reps;Knee straight    Abduction Limitations  pt reporting knees feel weak before lateral hips fatigue      Knee/Hip Exercises: Sidelying   Clams  lt sidelying:  15 reps no resistance; 10 reps red band; with band reported more posterior thigh pain than outer hip fatigue therefore exercise not added to HEP      Vestibular Treatment/Exercise - 06/01/17 0001      Vestibular Treatment/Exercise   Vestibular Treatment Provided  Gaze    Gaze Exercises  X1 Viewing Horizontal;X1 Viewing Vertical      X1 Viewing Horizontal   Foot Position  seated    Reps  4    Comments  30 seconds focus on proper degree of head turn & incr velocity      X1 Viewing Vertical   Foot Position  seated    Reps  3    Comments  30 seconds difficulty with eyes slipping off target; need to incr veloc         Balance Exercises - 06/01/17 2022      OTAGO PROGRAM   Backwards Walking  Support    One Leg Stand  10 seconds, support      Removed exercises from her booklet for exercises without UE support. Brief overview of several exercises in her  booklet that she has not yet been instructed on. TBA   PT Education - 06/01/17 2023    Education provided  Yes    Education Details  continue VORx1 exercises in sitting until can increase velocity; will then progress to standing    Person(s) Educated  Patient    Methods  Explanation;Demonstration;Verbal cues    Comprehension  Verbalized understanding;Returned demonstration;Need further instruction          PT Long Term Goals - 05/18/17 1642      PT LONG TERM GOAL #1   Title  Patient will be independent with HEP to address decreased balance and general weakness. (Target all LTGs 06/11/17)    Time  4    Period  Weeks    Status  New      PT LONG TERM GOAL #2   Title  Patient will verbalize a plan for continued community-based activity/exercise upon discharge from PT.    Time  4    Period  Weeks    Status  New      PT LONG TERM GOAL #3   Title  Patient will complete 6MWT with LRAD and goal TBA based on results.     Time  1    Period  Weeks    Status  Achieved      PT LONG TERM GOAL #4   Title  Patient will improve DGI score >=15 to indicate a lesser fall risk.     Baseline  05/12/17 12/24    Time  4    Period  Weeks    Status  New      PT LONG TERM GOAL #5   Title  Patient will complete TUG in <=15 seconds to indicate lesser fall risk.     Baseline  05/12/17  18.10 sec    Time  4    Period  Weeks    Status  New      Additional Long Term Goals   Additional Long Term Goals  Yes      PT LONG TERM GOAL #6   Title  Patient will increase her 6MWT distance by 150 ft with SaO2 >90% to demonstrate improved safety with return to exercise in the community.     Time  4    Period  Weeks    Status  New  Plan - 06/01/17 2027    Clinical Impression Statement  Session focused on technique with VORx1 exercises (degree of and speed of head turns), gait training with SPC, and exercises for strength and balance. Patient progressing slowly with vestibular rehab due to  difficulty achieving appropriate velocity with exercises. Anticipate patient will require additional PT visits/re-certification as she only has 3 remaining approved PT visits. Will address with pt on next visit.     Rehab Potential  Good    Clinical Impairments Affecting Rehab Potential  cardiopulmonary status, neuropathy    PT Frequency  2x / week    PT Duration  4 weeks    PT Treatment/Interventions  ADLs/Self Care Home Management;DME Instruction;Gait training;Stair training;Balance training;Therapeutic exercise;Therapeutic activities;Functional mobility training;Neuromuscular re-education;Patient/family education;Vestibular;Visual/perceptual remediation/compensation    PT Next Visit Plan  discuss ? add addti'l appts; progress x 1 viewing to standing; corner balance exercises for multi-sensory challenges.  continue [place] HEP for balance (corner balance exercises can be updated as needed) ;Continue gait with SPC (work towards outdoors as able)    Oncologist with Plan of Care  Patient       Patient will benefit from skilled therapeutic intervention in order to improve the following deficits and impairments:  Abnormal gait, Cardiopulmonary status limiting activity, Decreased balance, Decreased endurance, Decreased knowledge of use of DME, Decreased strength, Dizziness, Impaired sensation, Impaired vision/preception(using progressive lenses x 6 months "difficult to get used to" )  Visit Diagnosis: Unsteadiness on feet  Muscle weakness (generalized)  Other abnormalities of gait and mobility     Problem List Patient Active Problem List   Diagnosis Date Noted  . Allergic rhinitis 01/01/2016  . Bronchiectasis without acute exacerbation ([place]) 11/03/2014  . COPD with emphysema ([place]) 11/03/2014  . Lung cancer ([place]) 11/03/2014  . Peripheral neuralgia 11/03/2014    Rexanne Mano, PT 06/01/2017, 8:32 PM  [facility] [address]  [place], [place], [address] Phone: [phone]   Fax:  [phone]  Name: Jocelyn Ramirez MRN: [id number] Date of Birth: 29-Jun-1928

## 2017-06-04 ENCOUNTER — Ambulatory Visit: Payer: Medicare Other | Admitting: Physical Therapy

## 2017-06-05 ENCOUNTER — Ambulatory Visit: Payer: Medicare Other | Admitting: Physical Therapy

## 2017-06-05 ENCOUNTER — Ambulatory Visit: Payer: Medicare Other

## 2017-06-05 ENCOUNTER — Encounter: Payer: Self-pay | Admitting: Physical Therapy

## 2017-06-05 DIAGNOSIS — M6281 Muscle weakness (generalized): Secondary | ICD-10-CM | POA: Diagnosis not present

## 2017-06-05 DIAGNOSIS — R42 Dizziness and giddiness: Secondary | ICD-10-CM | POA: Diagnosis not present

## 2017-06-05 DIAGNOSIS — R2681 Unsteadiness on feet: Secondary | ICD-10-CM

## 2017-06-05 DIAGNOSIS — R498 Other voice and resonance disorders: Secondary | ICD-10-CM | POA: Diagnosis not present

## 2017-06-05 DIAGNOSIS — R2689 Other abnormalities of gait and mobility: Secondary | ICD-10-CM | POA: Diagnosis not present

## 2017-06-05 NOTE — Therapy (Signed)
[facility] [address] [place], [place], [address] Phone: [phone]   Fax:  [phone]  Speech Language Pathology Treatment  Patient Details  Name: Jocelyn Ramirez MRN: [id number] Date of Birth: [birth year] Referring Provider: Dr. Gavin Pound   Encounter Date: 06/05/2017  End of Session - 06/05/17 1614    Visit Number  13    Number of Visits  17    Date for SLP Re-Evaluation  06/12/17    SLP Start Time  1450    SLP Stop Time   1530    SLP Time Calculation (min)  40 min    Activity Tolerance  Patient tolerated treatment well       Past Medical History:  Diagnosis Date  . COPD (chronic obstructive pulmonary disease) ([place])   . Lung cancer ([place])   . Peripheral neuropathy     Past Surgical History:  Procedure Laterality Date  . endometrial tumor    . perforated abcess on colon  1992  . RLL lobectomy  11/1992  . VAGINAL HYSTERECTOMY      There were no vitals filed for this visit.  Subjective Assessment - 06/05/17 1409    Subjective  "I do more sometimes, and less sometimes."            ADULT SLP TREATMENT - 06/05/17 1410      General Information   Behavior/Cognition  Alert;Cooperative;Pleasant mood      Treatment Provided   Treatment provided  Cognitive-Linquistic      Cognitive-Linquistic Treatment   Treatment focused on  Voice    Skilled Treatment  Pt performed HEP with SBA. SLP educated pt on general vocal hygiene, pt is not drinking 60 oz/day. Pt maintained stronger voicing for 25 minutes conversation with occasional voicing breaks. Abdominal breathing >75% of the time in conversation. Pt suggested she return in the future to "see if she's been keeping up." SLP offered 3 - 6 months re-evaluation and pt was agreeable to this. SLP encouraged pt to obtain a script for this.       Assessment / Recommendations / Plan   Plan  -- d/c      Progression Toward Goals   Progression toward goals   -- d/c day - see goal update       SLP Education - 06/05/17 1626    Education provided  Yes    Education Details  cont voice exercises x3/week (all exercises!), vocal hygiene, need to drink more    Person(s) Educated  Patient    Methods  Explanation    Comprehension  Verbalized understanding       SLP Short Term Goals - 04/16/17 1902      SLP SHORT TERM GOAL #1   Title  Pt will perfrom HEP for vocal fold adduction with occasional min A over 2 sessions    Status  Partially Met      SLP SHORT TERM GOAL #2   Title  Pt will demonstrate abdominal breathing in structured speech tasks 18/20 responses with occasional min A    Status  Achieved      SLP SHORT TERM GOAL #3   Title  Pt will report 25% reduction in dysphonia, subjectively, over 2 sessions    Status  Partially Met       SLP Long Term Goals - 06/05/17 1618      SLP LONG TERM GOAL #1   Title  Pt will perform HEP for dysphonia with rare min A  Status  Achieved      SLP LONG TERM GOAL #2   Title  Pt will demonstrate abdominal breathing and adequate volume in simple conversation over 8 minutes with rare min A    Status  Achieved      SLP LONG TERM GOAL #3   Title  Pt will achieve WNL phonation over 15 minute conversation with rare min A    Status  Achieved      SLP LONG TERM GOAL #4   Title  pt will achieve WNL phonation in 20 minutes conversation in 2 ST sessions at least two weeks apart    Status  Achieved      SLP LONG TERM GOAL #5   Title  Pt will report 50% reduction in dysphonia subjectively, over 3 sessions    Status  Deferred       Plan - 06/05/17 1614    Clinical Impression Statement  Pt reports completing the HEP somehwat regularly since last session. She arrived again with raspy/dry sounding voice, however not as much as last session. She needs to cont with HEP at x3/week for maintenance. SLP educated pt re: general Insurance underwriter. Again, pt demo'd appropriate abdominal breathing and breath support, Pt  is appropriate for d/c at this time and requests she return in approx 3 months for follow up. SLP told pt we could check on how her HEP is working for her, and that she would need a prescription for that, and pt was agreeable.     Treatment/Interventions  Cueing hierarchy;Multimodal communcation approach;Functional tasks;Patient/family education;Compensatory techniques;Environmental controls;Internal/external aids;SLP instruction and feedback    Potential to Achieve Goals  Fair    Potential Considerations  Severity of impairments, cooperation with/participation in/compliance with HEP   SLP Home Exercise Plan  HEP for vocal fold adduction - see pt instructions    Consulted and Agree with Plan of Care  Patient       Patient will benefit from skilled therapeutic intervention in order to improve the following deficits and impairments:   Other voice and resonance disorders   SPEECH THERAPY DISCHARGE SUMMARY  Visits from Start of Care: 13  Current functional level related to goals / functional outcomes: Pt made modest gains in skilled ST although progress was highly dependent upon pt's frequent completion of home exercises. When frequency waned, pt's voice was more hoarse/aphonic, and when pt adhered to frequency of HEP that was prescribed by SLPs, vocal quality improved. Pt desires follow up in 3 months. She will need to obtain a script for this.   Remaining deficits: Hoarseness, mild-mod.   Education / Equipment: Vocal hygiene, abdominal breathing, vocal stamina/strength requires abdominal support.  Plan: Patient agrees to discharge.  Patient goals were partially met. Patient is being discharged due to                                                     ?????meeting current rehab potential/meeting designed goals.       Problem List Patient Active Problem List   Diagnosis Date Noted  . Allergic rhinitis 01/01/2016  . Bronchiectasis without acute exacerbation ([place]) 11/03/2014  . COPD  with emphysema ([place]) 11/03/2014  . Lung cancer ([place]) 11/03/2014  . Peripheral neuralgia 11/03/2014    [facility] ,[place], CCC-SLP  06/05/2017, 4:27 PM  [facility] [phone]  [place], [place], [address] Phone: [phone]   Fax:  [phone]   Name: Jocelyn Ramirez MRN: [id number] Date of Birth: Feb 02, 1929

## 2017-06-05 NOTE — Patient Instructions (Signed)
Continue to do your exercises 3 times a week.  It would be best for you to increase your water intake, to give your vocal folds proper hydration. Think about a humidifier for your bedroom to run at night.   Call friends or family, and/or make social appointments at least 4-5 times a week to practice your stronger voice.   If you'd like you could return in approx 3 months for a checkup. We will need a prescription for this.

## 2017-06-05 NOTE — Patient Instructions (Signed)
Gaze Stabilization: Tip Card  1.Target must remain in focus, not blurry, and appear stationary while head is in motion. 2.Perform exercises with small head movements (45 to either side of midline). 3.Increase speed of head motion so long as target is in focus. 4.If you wear eyeglasses, be sure you can see target through lens (therapist will give specific instructions for bifocal / progressive lenses). 5.These exercises may provoke dizziness or nausea. Work through these symptoms. If too dizzy, slow head movement slightly. Rest between each exercise. 6.Exercises demand concentration; avoid distractions.  Copyright  VHI. All rights reserved.     Special Instructions: Exercises may bring on mild to moderate symptoms of dizziness that resolve within 30 minutes of completing exercises. If symptoms are lasting longer than 30 minutes, modify your exercises by:  >decreasing the # of times you complete each activity >ensuring your symptoms return to baseline before moving onto the next exercise >dividing up exercises so you do not do them all in one session, but multiple short sessions throughout the day >doing them once a day until symptoms improve    For safety, perform standing exercises close to a counter, wall, corner, or next to someone.   Gaze Stabilization - Standing Feet Apart   Feet shoulder width apart, keeping eyes on target on wall 3-4 feet away, tilt head down slightly and move head side to side for 30 seconds. Repeat while moving head up and down for 30 seconds. *Work up to tolerating 60 seconds, as able. Do 2-3 sessions per day.   Copyright  VHI. All rights reserved.

## 2017-06-05 NOTE — Therapy (Signed)
[facility] [address] [place], [place], [address] Phone: [phone]   Fax:  [phone]  Physical Therapy Treatment  Patient Details  Name: Jocelyn Ramirez MRN: [id number] Date of Birth: Jan 04, 1929 Referring Provider: Helayne Seminole, MD   Encounter Date: 06/05/2017  PT End of Session - 06/05/17 1600    Visit Number  7    Number of Visits  9    Date for PT Re-Evaluation  06/11/17    Authorization Type  Medicare; BCBS    Authorization Time Period  05/12/17  to  07/11/2017    Authorization - Visit Number  10 SLP and PT visits both count    Authorization - Number of Visits  75 PT, OT, SLP combined    PT Start Time  2951 late arrival from Speech    PT Stop Time  1535    PT Time Calculation (min)  43 min    Activity Tolerance  Patient tolerated treatment well    Behavior During Therapy  [facility] for tasks assessed/performed       Past Medical History:  Diagnosis Date  . COPD (chronic obstructive pulmonary disease) ([place])   . Lung cancer ([place])   . Peripheral neuropathy     Past Surgical History:  Procedure Laterality Date  . endometrial tumor    . perforated abcess on colon  1992  . RLL lobectomy  11/1992  . VAGINAL HYSTERECTOMY      There were no vitals filed for this visit.  Subjective Assessment - 06/05/17 1500    Subjective  Reports she is having ankle swelling left more than right. Thinks it is due to eating 50% salted nuts while family has been in town. Asking if OK to exercise with this.    Pertinent History  afib, OP, COPD, peripheral neuropathy (pins and needles; heavy legs), vocal fold mass, mesenteric mass (new, following)    Patient Stated Goals  I want to find out if I need this stick because of the mental side of things or if I really need it.     Currently in Pain?  No/denies                      [facility] Adult PT Treatment/Exercise - 06/05/17 1555      Transfers   Transfers  Sit to  Stand;Stand to Sit    Sit to Stand  5: Supervision;4: Min guard    Sit to Stand Details (indicate cue type and reason)  with SPC, pt more hesitant and reaching for RUE support (cane in lt hand)    Stand to Sit  5: Supervision      Ambulation/Gait   Ambulation/Gait Assistance  4: Min guard    Ambulation/Gait Assistance Details  with SPC, with head turns horizontal and vertical; end of session walked out to her car with [facility] (she did not feel safe trying SPC)    Ambulation Distance (Feet)  400 Feet 150    Assistive device  Straight cane    Gait Pattern  Step-through pattern;Decreased stride length;Decreased weight shift to right;Decreased trunk rotation;Wide base of support    Ambulation Surface  Level;Indoor;Outdoor;Paved      Vestibular Treatment/Exercise - 06/05/17 1553      Vestibular Treatment/Exercise   Vestibular Treatment Provided  Gaze    Gaze Exercises  X1 Viewing Horizontal;X1 Viewing Vertical      X1 Viewing Horizontal   Foot Position  standing, two fingers on chair back  Time  -- 30 sec x 2; 45 sec x 1    Reps  3    Comments  >30 seconds eyes began slipping off target when head turns to her right (she denied letter moving, blurry or doubled however)      X1 Viewing Vertical   Foot Position  standing, two fingers on chair back    Time  -- 30 sec    Reps  1    Comments  no symptoms         Balance Exercises - 06/05/17 1553      OTAGO PROGRAM   Neck Movements  Sitting;5 reps    Ankle Movements  Sitting;10 reps        PT Education - 06/05/17 1559    Education provided  Yes    Education Details  updated VOR x1 exercise in standing with very light (2 finger support) on chair; while ankles swollen, do not do single leg standing or any static standing [place] exercises as static standing will incr ankle edema    Person(s) Educated  Patient    Methods  Explanation;Demonstration;Verbal cues;Handout    Comprehension  Verbalized understanding;Returned  demonstration;Verbal cues required;Need further instruction          PT Long Term Goals - 05/18/17 1642      PT LONG TERM GOAL #1   Title  Patient will be independent with HEP to address decreased balance and general weakness. (Target all LTGs 06/11/17)    Time  4    Period  Weeks    Status  New      PT LONG TERM GOAL #2   Title  Patient will verbalize a plan for continued community-based activity/exercise upon discharge from PT.    Time  4    Period  Weeks    Status  New      PT LONG TERM GOAL #3   Title  Patient will complete 6MWT with LRAD and goal TBA based on results.     Time  1    Period  Weeks    Status  Achieved      PT LONG TERM GOAL #4   Title  Patient will improve DGI score >=15 to indicate a lesser fall risk.     Baseline  05/12/17 12/24    Time  4    Period  Weeks    Status  New      PT LONG TERM GOAL #5   Title  Patient will complete TUG in <=15 seconds to indicate lesser fall risk.     Baseline  05/12/17  18.10 sec    Time  4    Period  Weeks    Status  New      Additional Long Term Goals   Additional Long Term Goals  Yes      PT LONG TERM GOAL #6   Title  Patient will increase her 6MWT distance by 150 ft with SaO2 >90% to demonstrate improved safety with return to exercise in the community.     Time  4    Period  Weeks    Status  New            Plan - 06/05/17 1603    Clinical Impression Statement  Session focused on vestibular rehab (advancing VOR x 1 exercise to standing), balance and gait training. Patient required increased time to review current HEP as she thought she was to do [place] exercises 3x/day (actually 3x/week). By end  of session, her written HEP was updated for clarification. Patient can benefit from additional PT visits--will need to discuss next visit.     Rehab Potential  Good    Clinical Impairments Affecting Rehab Potential  cardiopulmonary status, neuropathy    PT Frequency  2x / week    PT Duration  4 weeks    PT  Treatment/Interventions  ADLs/Self Care Home Management;DME Instruction;Gait training;Stair training;Balance training;Therapeutic exercise;Therapeutic activities;Functional mobility training;Neuromuscular re-education;Patient/family education;Vestibular;Visual/perceptual remediation/compensation    PT Next Visit Plan  discuss ? add addti'l appts; corner balance exercises for multi-sensory challenges.  finish instruction in [place] for balance--any exercise in green binder that is not marked with pink highlighter; (corner balance exercises can be updated as needed) ;Continue gait with SPC (work towards outdoors as able)    Oncologist with Plan of Care  Patient       Patient will benefit from skilled therapeutic intervention in order to improve the following deficits and impairments:  Abnormal gait, Cardiopulmonary status limiting activity, Decreased balance, Decreased endurance, Decreased knowledge of use of DME, Decreased strength, Dizziness, Impaired sensation, Impaired vision/preception(using progressive lenses x 6 months "difficult to get used to" )  Visit Diagnosis: Unsteadiness on feet  Other abnormalities of gait and mobility     Problem List Patient Active Problem List   Diagnosis Date Noted  . Allergic rhinitis 01/01/2016  . Bronchiectasis without acute exacerbation ([place]) 11/03/2014  . COPD with emphysema ([place]) 11/03/2014  . Lung cancer ([place]) 11/03/2014  . Peripheral neuralgia 11/03/2014    Rexanne Mano, PT 06/05/2017, 4:07 PM  [facility] [address] [place], [place], [address] Phone: [phone]   Fax:  [phone]  Name: Shiron Whetsel MRN: [id number] Date of Birth: 1929-01-05

## 2017-06-08 ENCOUNTER — Encounter: Payer: Self-pay | Admitting: Physical Therapy

## 2017-06-08 ENCOUNTER — Ambulatory Visit: Payer: Medicare Other | Admitting: Physical Therapy

## 2017-06-08 VITALS — BP 112/72

## 2017-06-08 DIAGNOSIS — R2689 Other abnormalities of gait and mobility: Secondary | ICD-10-CM

## 2017-06-08 DIAGNOSIS — M6281 Muscle weakness (generalized): Secondary | ICD-10-CM | POA: Diagnosis not present

## 2017-06-08 DIAGNOSIS — R498 Other voice and resonance disorders: Secondary | ICD-10-CM | POA: Diagnosis not present

## 2017-06-08 DIAGNOSIS — R2681 Unsteadiness on feet: Secondary | ICD-10-CM | POA: Diagnosis not present

## 2017-06-08 DIAGNOSIS — R42 Dizziness and giddiness: Secondary | ICD-10-CM

## 2017-06-08 NOTE — Therapy (Signed)
[facility] [address] [place], [place], [address] Phone: [phone]   Fax:  [phone]  Physical Therapy Treatment  Patient Details  Name: Jocelyn Ramirez MRN: [id number] Date of Birth: March 15, 1929 Referring Provider: Helayne Seminole, MD   Encounter Date: 06/08/2017  PT End of Session - 06/08/17 1913    Visit Number  8    Number of Visits  17    Date for PT Re-Evaluation  08/07/17    Authorization Type  Medicare; BCBS    Authorization Time Period  05/12/17  to  07/11/2017; 06/08/17 to 08/07/17    Authorization - Visit Number  12 SLP and PT visits both count    Authorization - Number of Visits  75 PT, OT, SLP combined    PT Start Time  1315    PT Stop Time  1401    PT Time Calculation (min)  46 min    Activity Tolerance  Patient tolerated treatment well    Behavior During Therapy  [facility] for tasks assessed/performed       Past Medical History:  Diagnosis Date  . COPD (chronic obstructive pulmonary disease) ([place])   . Lung cancer ([place])   . Peripheral neuropathy     Past Surgical History:  Procedure Laterality Date  . endometrial tumor    . perforated abcess on colon  1992  . RLL lobectomy  11/1992  . VAGINAL HYSTERECTOMY      Vitals:   06/08/17 1321  BP: 112/72    Subjective Assessment - 06/08/17 1318    Subjective  Agrees we need to extend PT appointments. Does not get dizzy feeling when doing or after the vestibular exercises. Does get light-headed every morning after being up on her feet for a little while (has gone restroom, made coffee, and then goes back to her bedroom and begins to feel it)    Pertinent History  afib, OP, COPD, peripheral neuropathy (pins and needles; heavy legs), vocal fold mass, mesenteric mass (new, following)    Patient Stated Goals  I want to find out if I need this stick because of the mental side of things or if I really need it.     Currently in Pain?  No/denies          [facility] PT Assessment - 06/08/17 0001      6 Minute Walk- Baseline   HR (bpm)  72    02 Sat (%RA)  90 %    Perceived Rate of Exertion (Borg)  7- Very, very light      6 Minute walk- Post Test   HR (bpm)  87    02 Sat (%RA)  82 % 3 minutes to return to  90%    Modified Borg Scale for Dyspnea  3- Moderate shortness of breath or breathing difficulty    Perceived Rate of Exertion (Borg)  13- Somewhat hard      6 minute walk test results    Aerobic Endurance Distance Walked  919 with SPC; originally with [facility]    Endurance additional comments  felt she could have gone another 6 minutes      Dynamic Gait Index   Level Surface  Mild Impairment    Change in Gait Speed  Mild Impairment    Gait with Horizontal Head Turns  Severe Impairment    Gait with Vertical Head Turns  Mild Impairment    Gait and Pivot Turn  Mild Impairment    Step Over Obstacle  Mild Impairment    Step Around Obstacles  Normal    Steps  Moderate Impairment    Total Score  14      Timed Up and Go Test   Normal TUG (seconds)  20.3                  [facility] Adult PT Treatment/Exercise - 06/08/17 0001      Ambulation/Gait   Ambulation/Gait Assistance  5: Supervision    Ambulation/Gait Assistance Details  during 6MWT had 2 episodes of drifting off path and independently recovered    Ambulation Distance (Feet)  919 Feet    Assistive device  Straight cane    Gait Pattern  Step-through pattern;Decreased stride length;Decreased trunk rotation;Wide base of support    Ambulation Surface  Level;Indoor                  PT Long Term Goals - 06/08/17 1857      PT LONG TERM GOAL #1   Title  Patient will be independent with HEP to address decreased balance and general weakness. (Target all LTGs 06/11/17)    Time  4    Period  Weeks    Status  Achieved      PT LONG TERM GOAL #2   Title  Patient will verbalize a plan for continued community-based activity/exercise upon discharge from PT. (Target  for ongoing LTGs 07/08/17)    Baseline  2/11 plan to extend PT visits; not yet appropriate    Time  4    Period  Weeks    Status  On-going    Target Date  07/08/17      PT LONG TERM GOAL #3   Title  Patient will complete 6MWT with LRAD and goal TBA based on results.     Time  1    Period  Weeks    Status  Achieved      PT LONG TERM GOAL #4   Title  Patient will improve DGI score >=15 to indicate a lesser fall risk.     Baseline  05/12/17 12/24;  2/11  14/24    Time  4    Period  Weeks    Status  On-going      PT LONG TERM GOAL #5   Title  Patient will complete TUG in <=15 seconds to indicate lesser fall risk.     Baseline  05/12/17  18.10 sec (with [facility]); 2/11 20.28 (with SPC)    Time  4    Period  Weeks    Status  On-going      PT LONG TERM GOAL #6   Title  Patient will increase her 6MWT distance by 150 ft with SaO2 >90% to demonstrate improved safety with return to exercise in the community.     Baseline  2/11 incr by 245 ft; SaO2 dropped to 82%    Time  4    Period  Weeks    Status  On-going            Plan - 06/08/17 1902    Clinical Impression Statement  Assessed LTGs with pt meeting 2 of 6 goals and remaining 4 goals are ongoing (either improved but not to goal level or not yet appropriate to address). Patient reported she was having "not a good day," feeling more off balance and light headed (BP WNL). As pt remains at risk for falls based on TUG and DGI, patient requesting to continue PT. Re-certification completed for  an additional 4 weeks.     Rehab Potential  Good    Clinical Impairments Affecting Rehab Potential  cardiopulmonary status, neuropathy    PT Frequency  2x / week    PT Duration  4 weeks    PT Treatment/Interventions  ADLs/Self Care Home Management;DME Instruction;Gait training;Stair training;Balance training;Therapeutic exercise;Therapeutic activities;Functional mobility training;Neuromuscular re-education;Patient/family  education;Vestibular;Visual/perceptual remediation/compensation    PT Next Visit Plan  finish instruction in [place] for balance--any exercise in green binder that is not marked with pink highlighter; Continue gait with SPC (work towards outdoors as able) corner balance exercises for multi-sensory challenges.      Consulted and Agree with Plan of Care  Patient       Patient will benefit from skilled therapeutic intervention in order to improve the following deficits and impairments:  Abnormal gait, Cardiopulmonary status limiting activity, Decreased balance, Decreased endurance, Decreased knowledge of use of DME, Decreased strength, Dizziness, Impaired sensation, Impaired vision/preception(using progressive lenses x 6 months "difficult to get used to" )  Visit Diagnosis: Unsteadiness on feet - Plan: PT plan of care cert/re-cert  Other abnormalities of gait and mobility - Plan: PT plan of care cert/re-cert  Dizziness and giddiness - Plan: PT plan of care cert/re-cert  Muscle weakness (generalized) - Plan: PT plan of care cert/re-cert     Problem List Patient Active Problem List   Diagnosis Date Noted  . Allergic rhinitis 01/01/2016  . Bronchiectasis without acute exacerbation ([place]) 11/03/2014  . COPD with emphysema ([place]) 11/03/2014  . Lung cancer ([place]) 11/03/2014  . Peripheral neuralgia 11/03/2014    Rexanne Mano, PT 06/08/2017, 7:20 PM  [facility] [address] [place], [place], [address] Phone: [phone]   Fax:  [phone]  Name: Jocelyn Ramirez MRN: [id number] Date of Birth: 06/27/1928

## 2017-06-11 ENCOUNTER — Ambulatory Visit: Payer: Medicare Other | Admitting: Physical Therapy

## 2017-06-15 ENCOUNTER — Ambulatory Visit: Payer: Medicare Other | Admitting: Physical Therapy

## 2017-06-15 ENCOUNTER — Encounter: Payer: Self-pay | Admitting: Physical Therapy

## 2017-06-15 DIAGNOSIS — R2689 Other abnormalities of gait and mobility: Secondary | ICD-10-CM

## 2017-06-15 DIAGNOSIS — R2681 Unsteadiness on feet: Secondary | ICD-10-CM | POA: Diagnosis not present

## 2017-06-15 DIAGNOSIS — R42 Dizziness and giddiness: Secondary | ICD-10-CM | POA: Diagnosis not present

## 2017-06-15 DIAGNOSIS — R498 Other voice and resonance disorders: Secondary | ICD-10-CM | POA: Diagnosis not present

## 2017-06-15 DIAGNOSIS — M6281 Muscle weakness (generalized): Secondary | ICD-10-CM | POA: Diagnosis not present

## 2017-06-15 NOTE — Therapy (Signed)
[facility] [address] [place], [place], [address] Phone: [phone]   Fax:  [phone]  Physical Therapy Treatment  Patient Details  Name: Jocelyn Ramirez MRN: [id number] Date of Birth: 1928/11/23 Referring Provider: Helayne Seminole, MD   Encounter Date: 06/15/2017  PT End of Session - 06/15/17 1631    Visit Number  9    Number of Visits  17    Date for PT Re-Evaluation  08/07/17    Authorization Type  Medicare; BCBS    Authorization Time Period  05/12/17  to  07/11/2017; 06/08/17 to 08/07/17    Authorization - Visit Number  53 SLP and PT visits both count    Authorization - Number of Visits  75 PT, OT, SLP combined    PT Start Time  1532    PT Stop Time  1617    PT Time Calculation (min)  45 min    Activity Tolerance  Patient tolerated treatment well    Behavior During Therapy  [facility] for tasks assessed/performed       Past Medical History:  Diagnosis Date  . COPD (chronic obstructive pulmonary disease) ([place])   . Lung cancer ([place])   . Peripheral neuropathy     Past Surgical History:  Procedure Laterality Date  . endometrial tumor    . perforated abcess on colon  1992  . RLL lobectomy  11/1992  . VAGINAL HYSTERECTOMY      There were no vitals filed for this visit.  Subjective Assessment - 06/15/17 1537    Subjective  Using SPC inside (if she uses anything), still using SBQC when she goes out "into those wide open spaces." Walked for 1.5 hours in grocery store the other day (pushing cart)    Pertinent History  afib, OP, COPD, peripheral neuropathy (pins and needles; heavy legs), vocal fold mass, mesenteric mass (new, following)    Patient Stated Goals  I want to find out if I need this stick because of the mental side of things or if I really need it.     Currently in Pain?  Yes    Pain Score  5     Pain Location  Leg    Pain Orientation  Right;Left    Pain Descriptors / Indicators  Squeezing;Tingling     Pain Type  Chronic pain    Pain Radiating Towards  feet up to below her knees    Pain Onset  More than a month ago    Pain Frequency  Constant    Pain Relieving Factors  uses gabapentin (took 30 minutes before PT session)    Effect of Pain on Daily Activities  "you just deal with the legs you are dealt"                      [facility] Adult PT Treatment/Exercise - 06/15/17 1608      Transfers   Transfers  Sit to Stand;Stand to Sit    Sit to Stand  4: Min guard;With upper extremity assist    Sit to Stand Details (indicate cue type and reason)  with SPC; legs hurting more today therefore minguard with transitions with SPC     Stand to Sit  5: Supervision      Ambulation/Gait   Ambulation/Gait Assistance  4: Min guard;4: Min assist    Ambulation/Gait Assistance Details  pt requesting to hold PT hand when going up/down the sloping parts of the sidewalk or across grass;  Ambulation Distance (Feet)  1000 Feet    Assistive device  Straight cane    Gait Pattern  Step-through pattern;Decreased stride length;Decreased trunk rotation;Wide base of support    Ambulation Surface  Level;Outdoor;Paved;Unlevel;Grass    Curb  4: Min assist    Curb Details (indicate cue type and reason)  using SPC (pt's goal to return to using [facility]) with HHA on opposite side; inside curb (~7" high)      Knee/Hip Exercises: Aerobic   Nustep  L1 x 4 minutes warm-up for her stiff/sore legs prior to remainder of session                  PT Long Term Goals - 06/08/17 1857      PT LONG TERM GOAL #1   Title  Patient will be independent with HEP to address decreased balance and general weakness. (Target all LTGs 06/11/17)    Time  4    Period  Weeks    Status  Achieved      PT LONG TERM GOAL #2   Title  Patient will verbalize a plan for continued community-based activity/exercise upon discharge from PT. (Target for ongoing LTGs 07/08/17)    Baseline  2/11 plan to extend PT visits; not yet  appropriate    Time  4    Period  Weeks    Status  On-going    Target Date  07/08/17      PT LONG TERM GOAL #3   Title  Patient will complete 6MWT with LRAD and goal TBA based on results.     Time  1    Period  Weeks    Status  Achieved      PT LONG TERM GOAL #4   Title  Patient will improve DGI score >=15 to indicate a lesser fall risk.     Baseline  05/12/17 12/24;  2/11  14/24    Time  4    Period  Weeks    Status  On-going      PT LONG TERM GOAL #5   Title  Patient will complete TUG in <=15 seconds to indicate lesser fall risk.     Baseline  05/12/17  18.10 sec (with SBQC); 2/11 20.28 (with SPC)    Time  4    Period  Weeks    Status  On-going      PT LONG TERM GOAL #6   Title  Patient will increase her 6MWT distance by 150 ft with SaO2 >90% to demonstrate improved safety with return to exercise in the community.     Baseline  2/11 incr by 245 ft; SaO2 dropped to 82%    Time  4    Period  Weeks    Status  On-going            Plan - 06/15/17 1632    Clinical Impression Statement  Patient with slightly antalgic gait due to bil lower leg pain (neuropathic) which she reports is worse today due to the weather. Able to use SPC in gym and outdoors with close-guarding to min assist by PT. Patient progressing towards goals and continues to benefit    Rehab Potential  Good    Clinical Impairments Affecting Rehab Potential  cardiopulmonary status, neuropathy    PT Frequency  2x / week    PT Duration  4 weeks    PT Treatment/Interventions  ADLs/Self Care Home Management;DME Instruction;Gait training;Stair training;Balance training;Therapeutic exercise;Therapeutic activities;Functional mobility training;Neuromuscular re-education;Patient/family education;Vestibular;Visual/perceptual remediation/compensation  PT Next Visit Plan  finish instruction in [place] for balance--any exercise in green binder that is not marked with pink highlighter; Continue gait with SPC, ramp (work  towards outdoors as able) corner balance exercises for multi-sensory challenges.      Consulted and Agree with Plan of Care  Patient       Patient will benefit from skilled therapeutic intervention in order to improve the following deficits and impairments:  Abnormal gait, Cardiopulmonary status limiting activity, Decreased balance, Decreased endurance, Decreased knowledge of use of DME, Decreased strength, Dizziness, Impaired sensation, Impaired vision/preception(using progressive lenses x 6 months "difficult to get used to" )  Visit Diagnosis: Unsteadiness on feet  Other abnormalities of gait and mobility     Problem List Patient Active Problem List   Diagnosis Date Noted  . Allergic rhinitis 01/01/2016  . Bronchiectasis without acute exacerbation ([place]) 11/03/2014  . COPD with emphysema ([place]) 11/03/2014  . Lung cancer ([place]) 11/03/2014  . Peripheral neuralgia 11/03/2014    Rexanne Mano, PT 06/15/2017, 4:35 PM  [facility] [address] [place], [place], [address] Phone: [phone]   Fax:  [phone]  Name: Jocelyn Ramirez MRN: [id number] Date of Birth: [birth year]

## 2017-06-18 ENCOUNTER — Ambulatory Visit: Payer: Medicare Other | Admitting: Physical Therapy

## 2017-06-19 ENCOUNTER — Encounter: Payer: Self-pay | Admitting: Physical Therapy

## 2017-06-19 ENCOUNTER — Ambulatory Visit: Payer: Medicare Other | Admitting: Physical Therapy

## 2017-06-19 DIAGNOSIS — R2689 Other abnormalities of gait and mobility: Secondary | ICD-10-CM

## 2017-06-19 DIAGNOSIS — R42 Dizziness and giddiness: Secondary | ICD-10-CM | POA: Diagnosis not present

## 2017-06-19 DIAGNOSIS — M6281 Muscle weakness (generalized): Secondary | ICD-10-CM | POA: Diagnosis not present

## 2017-06-19 DIAGNOSIS — R498 Other voice and resonance disorders: Secondary | ICD-10-CM | POA: Diagnosis not present

## 2017-06-19 DIAGNOSIS — R2681 Unsteadiness on feet: Secondary | ICD-10-CM | POA: Diagnosis not present

## 2017-06-19 NOTE — Therapy (Signed)
[facility] [address] [place], [place], [address] Phone: [phone]   Fax:  [phone]  Physical Therapy Treatment  Patient Details  Name: Jocelyn Ramirez MRN: [id number] Date of Birth: [birth year] Referring Provider: Helayne Seminole, MD   Encounter Date: 06/19/2017  PT End of Session - 06/19/17 1349    Visit Number  10    Number of Visits  17    Date for PT Re-Evaluation  08/07/17    Authorization Type  Medicare; BCBS    Authorization Time Period  05/12/17  to  07/11/2017; 06/08/17 to 08/07/17    Authorization - Visit Number  65 SLP and PT visits both count    Authorization - Number of Visits  75 PT, OT, SLP combined    PT Start Time  1017    PT Stop Time  1104    PT Time Calculation (min)  47 min    Activity Tolerance  Patient tolerated treatment well    Behavior During Therapy  [facility] for tasks assessed/performed       Past Medical History:  Diagnosis Date  . COPD (chronic obstructive pulmonary disease) ([place])   . Lung cancer ([place])   . Peripheral neuropathy     Past Surgical History:  Procedure Laterality Date  . endometrial tumor    . perforated abcess on colon  1992  . RLL lobectomy  11/1992  . VAGINAL HYSTERECTOMY      There were no vitals filed for this visit.  Subjective Assessment - 06/19/17 1019    Subjective  Able to walk about 10 ft from parking spot to get shopping cart without her cane. States she was proud of herself and would not have tried to walk much furrther than that.     Pertinent History  afib, OP, COPD, peripheral neuropathy (pins and needles; heavy legs), vocal fold mass, mesenteric mass (new, following)    Patient Stated Goals  I want to find out if I need this stick because of the mental side of things or if I really need it.     Currently in Pain?  No/denies    Pain Onset  More than a month ago                      [facility] Adult PT Treatment/Exercise - 06/19/17  1353      Ambulation/Gait   Ambulation/Gait Assistance  4: Min guard    Ambulation/Gait Assistance Details  without cane in gym with close guarding by PT; only slight imbalance noted when turning    Ambulation Distance (Feet)  115 Feet    Assistive device  None    Gait Pattern  Step-through pattern;Decreased stride length;Decreased trunk rotation;Wide base of support;Decreased arm swing - right;Decreased arm swing - left    Ambulation Surface  Indoor          Balance Exercises - 06/19/17 1344      Balance Exercises: Standing   SLS with Vectors  Solid surface;Upper extremity assist 1 toe taps, heel taps with foam bubbles    Balance Beam  green theraband disc/foam; stood feet ~2" apart, EO, limited UE support on counter: head turns, drawing circles with her nose clockwise and counter-clockwise      OTAGO PROGRAM   Head Movements  Standing;5 reps    Neck Movements  Sitting;5 reps    Back Extension  Standing;5 reps    Trunk Movements  Standing;5 reps one hand on counter, other  arm reach around/behind    Walking and Turning Around  Assistive device    Overall OTAGO Comments  Finalized her [place] program (instructed in final exercises and removed pages of exercises she is not completing).         PT Education - 06/19/17 1348    Education provided  Yes    Education Details  finalized [employer]; frequency she should do exercises    Person(s) Educated  Patient    Methods  Explanation;Demonstration;Handout    Comprehension  Returned demonstration;Verbalized understanding;Verbal cues required          PT Long Term Goals - 06/08/17 1857      PT LONG TERM GOAL #1   Title  Patient will be independent with HEP to address decreased balance and general weakness. (Target all LTGs 06/11/17)    Time  4    Period  Weeks    Status  Achieved      PT LONG TERM GOAL #2   Title  Patient will verbalize a plan for continued community-based activity/exercise upon discharge from PT. (Target  for ongoing LTGs 07/08/17)    Baseline  2/11 plan to extend PT visits; not yet appropriate    Time  4    Period  Weeks    Status  On-going    Target Date  07/08/17      PT LONG TERM GOAL #3   Title  Patient will complete 6MWT with LRAD and goal TBA based on results.     Time  1    Period  Weeks    Status  Achieved      PT LONG TERM GOAL #4   Title  Patient will improve DGI score >=15 to indicate a lesser fall risk.     Baseline  05/12/17 12/24;  2/11  14/24    Time  4    Period  Weeks    Status  On-going      PT LONG TERM GOAL #5   Title  Patient will complete TUG in <=15 seconds to indicate lesser fall risk.     Baseline  05/12/17  18.10 sec (with SBQC); 2/11 20.28 (with SPC)    Time  4    Period  Weeks    Status  On-going      PT LONG TERM GOAL #6   Title  Patient will increase her 6MWT distance by 150 ft with SaO2 >90% to demonstrate improved safety with return to exercise in the community.     Baseline  2/11 incr by 245 ft; SaO2 dropped to 82%    Time  4    Period  Weeks    Status  On-going            Plan - 06/19/17 1350    Clinical Impression Statement  Patient eager to learn remaining exercises in [place] program and reports enjoyment doing her exercises and seeing progress. Also focused on static balance on foam and dynamic balance on level floor. Patient will continue to benefit from PT to work towards goals.     Rehab Potential  Good    Clinical Impairments Affecting Rehab Potential  cardiopulmonary status, neuropathy    PT Frequency  2x / week    PT Duration  4 weeks    PT Treatment/Interventions  ADLs/Self Care Home Management;DME Instruction;Gait training;Stair training;Balance training;Therapeutic exercise;Therapeutic activities;Functional mobility training;Neuromuscular re-education;Patient/family education;Vestibular;Visual/perceptual remediation/compensation    PT Next Visit Plan  Continue gait with SPC, including ramp (work towards  outdoors as able) corner  balance exercises for multi-sensory challenges; dynamic balance activities including on compliant surfaces    Consulted and Agree with Plan of Care  Patient       Patient will benefit from skilled therapeutic intervention in order to improve the following deficits and impairments:  Abnormal gait, Cardiopulmonary status limiting activity, Decreased balance, Decreased endurance, Decreased knowledge of use of DME, Decreased strength, Dizziness, Impaired sensation, Impaired vision/preception(using progressive lenses x 6 months "difficult to get used to" )  Visit Diagnosis: Unsteadiness on feet  Other abnormalities of gait and mobility     Problem List Patient Active Problem List   Diagnosis Date Noted  . Allergic rhinitis 01/01/2016  . Bronchiectasis without acute exacerbation ([place]) 11/03/2014  . COPD with emphysema ([place]) 11/03/2014  . Lung cancer ([place]) 11/03/2014  . Peripheral neuralgia 11/03/2014    Rexanne Mano, PT 06/19/2017, 1:56 PM  [facility] [address] [place], [place], [address] Phone: [phone]   Fax:  [phone]  Name: Fedora Knisely MRN: [id number] Date of Birth: 1928-06-08

## 2017-06-22 ENCOUNTER — Encounter: Payer: Self-pay | Admitting: Physical Therapy

## 2017-06-22 ENCOUNTER — Ambulatory Visit: Payer: Medicare Other | Admitting: Physical Therapy

## 2017-06-22 DIAGNOSIS — R42 Dizziness and giddiness: Secondary | ICD-10-CM | POA: Diagnosis not present

## 2017-06-22 DIAGNOSIS — M6281 Muscle weakness (generalized): Secondary | ICD-10-CM | POA: Diagnosis not present

## 2017-06-22 DIAGNOSIS — R2689 Other abnormalities of gait and mobility: Secondary | ICD-10-CM | POA: Diagnosis not present

## 2017-06-22 DIAGNOSIS — R498 Other voice and resonance disorders: Secondary | ICD-10-CM | POA: Diagnosis not present

## 2017-06-22 DIAGNOSIS — R2681 Unsteadiness on feet: Secondary | ICD-10-CM

## 2017-06-23 NOTE — Therapy (Signed)
[facility] [address] [place], [place], [address] Phone: [phone]   Fax:  [phone]  Physical Therapy Treatment  Patient Details  Name: Jocelyn Ramirez MRN: [id number] Date of Birth: 05-02-1928 Referring Provider: Helayne Seminole, MD   Encounter Date: 06/22/2017  PT End of Session - 06/23/17 0531    Visit Number  11    Number of Visits  17    Date for PT Re-Evaluation  08/07/17    Authorization Type  Medicare; BCBS    Authorization Time Period  05/12/17  to  07/11/2017; 06/08/17 to 08/07/17    Authorization - Visit Number  87 SLP and PT visits both count    Authorization - Number of Visits  75 PT, OT, SLP combined    PT Start Time  1403    PT Stop Time  1445    PT Time Calculation (min)  42 min    Activity Tolerance  Patient tolerated treatment well    Behavior During Therapy  [facility] for tasks assessed/performed       Past Medical History:  Diagnosis Date  . COPD (chronic obstructive pulmonary disease) ([place])   . Lung cancer ([place])   . Peripheral neuropathy     Past Surgical History:  Procedure Laterality Date  . endometrial tumor    . perforated abcess on colon  1992  . RLL lobectomy  11/1992  . VAGINAL HYSTERECTOMY      There were no vitals filed for this visit.  Subjective Assessment - 06/22/17 1406    Subjective  No problems or concerns    Pertinent History  afib, OP, COPD, peripheral neuropathy (pins and needles; heavy legs), vocal fold mass, mesenteric mass (new, following)    Patient Stated Goals  I want to find out if I need this stick because of the mental side of things or if I really need it.     Currently in Pain?  No/denies    Pain Onset  More than a month ago                      [facility] Adult PT Treatment/Exercise - 06/22/17 0001      Transfers   Transfers  Sit to Stand;Stand to Sit    Sit to Stand  6: Modified independent (Device/Increase time);4: Min guard;With upper  extremity assist    Sit to Stand Details (indicate cue type and reason)  with cane vs no cane; no LOB    Stand to Sit  5: Supervision      Ambulation/Gait   Ambulation/Gait Assistance  4: Min guard    Ambulation/Gait Assistance Details  with [facility]    Ambulation Distance (Feet)  100 Feet 115, 50, 50    Assistive device  Straight cane    Gait Pattern  Step-through pattern;Decreased stride length;Decreased trunk rotation;Wide base of support;Decreased arm swing - right;Decreased arm swing - left    Ambulation Surface  Indoor    Ramp  4: Min assist    Ramp Details (indicate cue type and reason)  SPC and light HHA to ascend and heavy HHA to descend; 2nd attempt with minguard and cane only INCR time      Knee/Hip Exercises: Aerobic   Nustep  L1 x 5 minutes, 4 extremities          Balance Exercises - 06/22/17 1435      Balance Exercises: Standing   SLS with Vectors  Foam/compliant surface;Upper extremity assist 1;Intermittent  upper extremity assist red mat; foam bubbles heel taps/toe taps    Stepping Strategy  Anterior;Posterior;Lateral;Foam/compliant surface;UE support;10 reps red mat    Sidestepping  Foam/compliant support;Upper extremity support;2 reps    Other Standing Exercises  on red mat, stepping to colored dots as called out, alternating use RLE, LLE; cane and light HHA             PT Long Term Goals - 06/08/17 1857      PT LONG TERM GOAL #1   Title  Patient will be independent with HEP to address decreased balance and general weakness. (Target all LTGs 06/11/17)    Time  4    Period  Weeks    Status  Achieved      PT LONG TERM GOAL #2   Title  Patient will verbalize a plan for continued community-based activity/exercise upon discharge from PT. (Target for ongoing LTGs 07/08/17)    Baseline  2/11 plan to extend PT visits; not yet appropriate    Time  4    Period  Weeks    Status  On-going    Target Date  07/08/17      PT LONG TERM GOAL #3   Title  Patient will  complete 6MWT with LRAD and goal TBA based on results.     Time  1    Period  Weeks    Status  Achieved      PT LONG TERM GOAL #4   Title  Patient will improve DGI score >=15 to indicate a lesser fall risk.     Baseline  05/12/17 12/24;  2/11  14/24    Time  4    Period  Weeks    Status  On-going      PT LONG TERM GOAL #5   Title  Patient will complete TUG in <=15 seconds to indicate lesser fall risk.     Baseline  05/12/17  18.10 sec (with SBQC); 2/11 20.28 (with SPC)    Time  4    Period  Weeks    Status  On-going      PT LONG TERM GOAL #6   Title  Patient will increase her 6MWT distance by 150 ft with SaO2 >90% to demonstrate improved safety with return to exercise in the community.     Baseline  2/11 incr by 245 ft; SaO2 dropped to 82%    Time  4    Period  Weeks    Status  On-going            Plan - 06/23/17 0532    Clinical Impression Statement  Session focused on gait training with SPC (including ramps, of which pt is fearful) and balance training on compliant surface to force use of vestibular system. Patient begins each new task very cautious however pushes herself to use less external support and maximally challenge her balance. She continues to make progress.     Rehab Potential  Good    Clinical Impairments Affecting Rehab Potential  cardiopulmonary status, neuropathy    PT Frequency  2x / week    PT Duration  4 weeks    PT Treatment/Interventions  ADLs/Self Care Home Management;DME Instruction;Gait training;Stair training;Balance training;Therapeutic exercise;Therapeutic activities;Functional mobility training;Neuromuscular re-education;Patient/family education;Vestibular;Visual/perceptual remediation/compensation    PT Next Visit Plan  ?Nustep to warm-up; Continue gait with SPC, including ramp (work towards outdoors as able) corner balance exercises for multi-sensory challenges; dynamic balance activities including on compliant surfaces    Consulted and Agree  with  Plan of Care  Patient       Patient will benefit from skilled therapeutic intervention in order to improve the following deficits and impairments:  Abnormal gait, Cardiopulmonary status limiting activity, Decreased balance, Decreased endurance, Decreased knowledge of use of DME, Decreased strength, Dizziness, Impaired sensation, Impaired vision/preception(using progressive lenses x 6 months "difficult to get used to" )  Visit Diagnosis: Unsteadiness on feet  Other abnormalities of gait and mobility     Problem List Patient Active Problem List   Diagnosis Date Noted  . Allergic rhinitis 01/01/2016  . Bronchiectasis without acute exacerbation ([place]) 11/03/2014  . COPD with emphysema ([place]) 11/03/2014  . Lung cancer ([place]) 11/03/2014  . Peripheral neuralgia 11/03/2014    Rexanne Mano, PT 06/23/2017, 5:38 AM  [facility] [address] [place], [place], [address] Phone: [phone]   Fax:  [phone]  Name: Jocelyn Ramirez MRN: [id number] Date of Birth: October 25, 1928

## 2017-06-25 DIAGNOSIS — H401133 Primary open-angle glaucoma, bilateral, severe stage: Secondary | ICD-10-CM | POA: Diagnosis not present

## 2017-06-25 DIAGNOSIS — H353132 Nonexudative age-related macular degeneration, bilateral, intermediate dry stage: Secondary | ICD-10-CM | POA: Diagnosis not present

## 2017-06-26 ENCOUNTER — Ambulatory Visit: Payer: Medicare Other | Attending: Otolaryngology | Admitting: Physical Therapy

## 2017-06-26 ENCOUNTER — Encounter: Payer: Self-pay | Admitting: Physical Therapy

## 2017-06-26 DIAGNOSIS — R2681 Unsteadiness on feet: Secondary | ICD-10-CM | POA: Diagnosis not present

## 2017-06-26 DIAGNOSIS — R42 Dizziness and giddiness: Secondary | ICD-10-CM | POA: Diagnosis not present

## 2017-06-26 DIAGNOSIS — R2689 Other abnormalities of gait and mobility: Secondary | ICD-10-CM | POA: Diagnosis not present

## 2017-06-27 NOTE — Therapy (Signed)
[facility] [address] [place], [place], [address] Phone: [phone]   Fax:  [phone]  Physical Therapy Treatment  Patient Details  Name: Jocelyn Ramirez MRN: [id number] Date of Birth: [birth year] Referring Provider: Helayne Seminole, MD   Encounter Date: 06/26/2017  PT End of Session - 06/27/17 0759    Visit Number  12    Number of Visits  17    Date for PT Re-Evaluation  08/07/17    Authorization Type  Medicare; BCBS    Authorization Time Period  05/12/17  to  07/11/2017; 06/08/17 to 08/07/17    Authorization - Visit Number  56 SLP and PT visits both count    Authorization - Number of Visits  75 PT, OT, SLP combined    PT Start Time  1200    PT Stop Time  1242    PT Time Calculation (min)  42 min    Activity Tolerance  Patient tolerated treatment well    Behavior During Therapy  [facility] for tasks assessed/performed       Past Medical History:  Diagnosis Date  . COPD (chronic obstructive pulmonary disease) ([place])   . Lung cancer ([place])   . Peripheral neuropathy     Past Surgical History:  Procedure Laterality Date  . endometrial tumor    . perforated abcess on colon  1992  . RLL lobectomy  11/1992  . VAGINAL HYSTERECTOMY      There were no vitals filed for this visit.  Subjective Assessment - 06/26/17 1201    Subjective  "Legs feel like jelly, it's the weather."    Pertinent History  afib, OP, COPD, peripheral neuropathy (pins and needles; heavy legs), vocal fold mass, mesenteric mass (new, following)    Patient Stated Goals  I want to find out if I need this stick because of the mental side of things or if I really need it.     Currently in Pain?  No/denies                      [facility] Adult PT Treatment/Exercise - 06/26/17 1208      Transfers   Transfers  Sit to Stand;Stand to Sit    Sit to Stand  6: Modified independent (Device/Increase time);4: Min guard;With upper extremity assist    Sit to Stand Details (indicate cue type and reason)  with SPC    Stand to Sit  6: Modified independent (Device/Increase time)      Ambulation/Gait   Ambulation/Gait Assistance  4: Min guard;4: Min assist    Ambulation/Gait Assistance Details  with SPC on level surface with minguard and no LOB; on ramp or curb min assist with pt exhibiting very cautious/hesitant technique    Ambulation Distance (Feet)  115 Feet 75, 115    Assistive device  Straight cane    Gait Pattern  Step-through pattern;Decreased stride length;Decreased trunk rotation;Wide base of support    Ambulation Surface  Indoor    Ramp  4: Min assist    Ramp Details (indicate cue type and reason)  x2 reps with pt more hesitant today and incr UE support opposite her cane; reporting legs feel like jelly    Curb  4: Min assist    Curb Details (indicate cue type and reason)  x3 with SPC and HHA      Knee/Hip Exercises: Aerobic   Stepper  Sci-Fit L1.1; all 4 x 3.5 min (pt prefers Nustep)  Balance Exercises - 06/27/17 0809      Balance Exercises: Standing   Standing Eyes Opened  Wide (BOA);Foam/compliant surface;30 secs 1" foam base with green theraband oval foam on top    SLS with Vectors  Solid surface;Upper extremity assist 1 step tap to top yoga block x 20 each leg    Step Ups  Forward;2 inch;UE support 1 onto blue airex foam    Tandem Gait  Forward;Intermittent upper extremity support    Retro Gait  Upper extremity support;4 reps    Sidestepping  Upper extremity support;2 reps    Other Standing Exercises  braiding with UE support x 8 lengths             PT Long Term Goals - 06/08/17 1857      PT LONG TERM GOAL #1   Title  Patient will be independent with HEP to address decreased balance and general weakness. (Target all LTGs 06/11/17)    Time  4    Period  Weeks    Status  Achieved      PT LONG TERM GOAL #2   Title  Patient will verbalize a plan for continued community-based activity/exercise upon  discharge from PT. (Target for ongoing LTGs 07/08/17)    Baseline  2/11 plan to extend PT visits; not yet appropriate    Time  4    Period  Weeks    Status  On-going    Target Date  07/08/17      PT LONG TERM GOAL #3   Title  Patient will complete 6MWT with LRAD and goal TBA based on results.     Time  1    Period  Weeks    Status  Achieved      PT LONG TERM GOAL #4   Title  Patient will improve DGI score >=15 to indicate a lesser fall risk.     Baseline  05/12/17 12/24;  2/11  14/24    Time  4    Period  Weeks    Status  On-going      PT LONG TERM GOAL #5   Title  Patient will complete TUG in <=15 seconds to indicate lesser fall risk.     Baseline  05/12/17  18.10 sec (with SBQC); 2/11 20.28 (with SPC)    Time  4    Period  Weeks    Status  On-going      PT LONG TERM GOAL #6   Title  Patient will increase her 6MWT distance by 150 ft with SaO2 >90% to demonstrate improved safety with return to exercise in the community.     Baseline  2/11 incr by 245 ft; SaO2 dropped to 82%    Time  4    Period  Weeks    Status  On-going            Plan - 06/27/17 0801    Clinical Impression Statement  Session focused on gait with SPC (including ramps and curbs) and balance training. She continues to improve her cadence and confidence when walking with SPC and pushes herself with balance activities. She can continue to benefit from PT to improve her safety with mobility and balance.     Rehab Potential  Good    Clinical Impairments Affecting Rehab Potential  cardiopulmonary status, neuropathy    PT Frequency  2x / week    PT Duration  4 weeks    PT Treatment/Interventions  ADLs/Self Care Home Management;DME Instruction;Gait training;Stair  training;Balance training;Therapeutic exercise;Therapeutic activities;Functional mobility training;Neuromuscular re-education;Patient/family education;Vestibular;Visual/perceptual remediation/compensation    PT Next Visit Plan  ?Nustep to warm-up;  Continue gait with SPC, including ramp (work towards outdoors as able) corner balance exercises for multi-sensory challenges; dynamic balance activities including on compliant surfaces(?activities in kitchen on red mat)    Consulted and Agree with Plan of Care  Patient       Patient will benefit from skilled therapeutic intervention in order to improve the following deficits and impairments:  Abnormal gait, Cardiopulmonary status limiting activity, Decreased balance, Decreased endurance, Decreased knowledge of use of DME, Decreased strength, Dizziness, Impaired sensation, Impaired vision/preception(using progressive lenses x 6 months "difficult to get used to" )  Visit Diagnosis: Unsteadiness on feet  Other abnormalities of gait and mobility     Problem List Patient Active Problem List   Diagnosis Date Noted  . Allergic rhinitis 01/01/2016  . Bronchiectasis without acute exacerbation ([place]) 11/03/2014  . COPD with emphysema ([place]) 11/03/2014  . Lung cancer ([place]) 11/03/2014  . Peripheral neuralgia 11/03/2014    Galvin Proffer 06/27/2017, 8:15 AM  [facility] [address] [place], [place], [address] Phone: [phone]   Fax:  [phone]  Name: Coumba Kellison MRN: [id number] Date of Birth: 05/29/1928

## 2017-06-29 ENCOUNTER — Ambulatory Visit: Payer: Medicare Other | Admitting: Physical Therapy

## 2017-06-29 ENCOUNTER — Encounter: Payer: Self-pay | Admitting: Physical Therapy

## 2017-06-29 DIAGNOSIS — R2689 Other abnormalities of gait and mobility: Secondary | ICD-10-CM | POA: Diagnosis not present

## 2017-06-29 DIAGNOSIS — R42 Dizziness and giddiness: Secondary | ICD-10-CM | POA: Diagnosis not present

## 2017-06-29 DIAGNOSIS — R2681 Unsteadiness on feet: Secondary | ICD-10-CM

## 2017-06-29 NOTE — Therapy (Signed)
[facility] [address] [place], [place], [address] Phone: [phone]   Fax:  [phone]  Physical Therapy Treatment  Patient Details  Name: Jocelyn Ramirez MRN: [id number] Date of Birth: Apr 22, 1929 Referring Provider: Helayne Seminole, MD   Encounter Date: 06/29/2017  PT End of Session - 06/29/17 1311    Visit Number  13    Number of Visits  17    Date for PT Re-Evaluation  08/07/17    Authorization Type  Medicare; BCBS    Authorization Time Period  05/12/17  to  07/11/2017; 06/08/17 to 08/07/17    Authorization - Visit Number  50 SLP and PT visits both count    Authorization - Number of Visits  75 PT, OT, SLP combined    PT Start Time  1152    PT Stop Time  1231    PT Time Calculation (min)  39 min    Activity Tolerance  Patient tolerated treatment well    Behavior During Therapy  [facility] for tasks assessed/performed       Past Medical History:  Diagnosis Date  . COPD (chronic obstructive pulmonary disease) ([place])   . Lung cancer ([place])   . Peripheral neuropathy     Past Surgical History:  Procedure Laterality Date  . endometrial tumor    . perforated abcess on colon  1992  . RLL lobectomy  11/1992  . VAGINAL HYSTERECTOMY      There were no vitals filed for this visit.  Subjective Assessment - 06/29/17 1202    Subjective  Reports she has the worst "woozy" feeling aobut 10:30 each morning. Takes her gabapentin at 8 am, 12 pm, 4 pm, 8 pm and before bed. Only time it makes her feel the wooziness is after 8 am dose.   (Pended)     Pertinent History  afib, OP, COPD, peripheral neuropathy (pins and needles; heavy legs), vocal fold mass, mesenteric mass (new, following)  (Pended)     Patient Stated Goals  I want to find out if I need this stick because of the mental side of things or if I really need it.   (Pended)     Currently in Pain?  No/denies  (Pended)                       [facility] Adult PT  Treatment/Exercise - 06/29/17 1304      Ambulation/Gait   Ambulation/Gait Assistance  4: Min guard    Ambulation/Gait Assistance Details  with SPC and close guarding without cane; maintains straight path and endorses she has been walking in her home without a device or reaching for external support    Ambulation Distance (Feet)  230 Feet 115    Assistive device  Straight cane;None    Gait Pattern  Step-through pattern;Decreased stride length;Decreased trunk rotation;Wide base of support    Ambulation Surface  Indoor          Balance Exercises - 06/29/17 1306      Balance Exercises: Standing   Rockerboard  Anterior/posterior;Head turns;EO;Intermittent UE support    Balance Beam  stepping over 3 beams spaced 14" apart with good foot clearance x 12 fwd, x9 backward; stepping from beam to beam with light UE assist on // bars     Gait with Head Turns  Forward;Upper extremity support using cane    Tandem Gait  Forward;Upper extremity support;4 reps // bars    Retro Gait  Upper extremity support;4  reps // bars    Marching Limitations  single UE on // bars             PT Long Term Goals - 06/08/17 1857      PT LONG TERM GOAL #1   Title  Patient will be independent with HEP to address decreased balance and general weakness. (Target all LTGs 06/11/17)    Time  4    Period  Weeks    Status  Achieved      PT LONG TERM GOAL #2   Title  Patient will verbalize a plan for continued community-based activity/exercise upon discharge from PT. (Target for ongoing LTGs 07/08/17)    Baseline  2/11 plan to extend PT visits; not yet appropriate    Time  4    Period  Weeks    Status  On-going    Target Date  07/08/17      PT LONG TERM GOAL #3   Title  Patient will complete 6MWT with LRAD and goal TBA based on results.     Time  1    Period  Weeks    Status  Achieved      PT LONG TERM GOAL #4   Title  Patient will improve DGI score >=15 to indicate a lesser fall risk.     Baseline   05/12/17 12/24;  2/11  14/24    Time  4    Period  Weeks    Status  On-going      PT LONG TERM GOAL #5   Title  Patient will complete TUG in <=15 seconds to indicate lesser fall risk.     Baseline  05/12/17  18.10 sec (with SBQC); 2/11 20.28 (with SPC)    Time  4    Period  Weeks    Status  On-going      PT LONG TERM GOAL #6   Title  Patient will increase her 6MWT distance by 150 ft with SaO2 >90% to demonstrate improved safety with return to exercise in the community.     Baseline  2/11 incr by 245 ft; SaO2 dropped to 82%    Time  4    Period  Weeks    Status  On-going            Plan - 06/29/17 1312    Clinical Impression Statement  Patient did very well today with light UE support during balance training (including compliant surfaces) and progressed to walking without SPC with close guarding assist without drifting off path or LOB. She reports her energy level and confidence have improved over the course of her PT sessions. She can continue to benefit from PT to achieve her goals.     Rehab Potential  Good    Clinical Impairments Affecting Rehab Potential  cardiopulmonary status, neuropathy    PT Frequency  2x / week    PT Duration  4 weeks    PT Treatment/Interventions  ADLs/Self Care Home Management;DME Instruction;Gait training;Stair training;Balance training;Therapeutic exercise;Therapeutic activities;Functional mobility training;Neuromuscular re-education;Patient/family education;Vestibular;Visual/perceptual remediation/compensation    PT Next Visit Plan  ?Nustep to warm-up; Continue gait with SPC, including ramp (work towards outdoors as able) corner balance exercises for multi-sensory challenges; dynamic balance activities including on compliant surfaces(?activities in kitchen on red mat)    Consulted and Agree with Plan of Care  Patient       Patient will benefit from skilled therapeutic intervention in order to improve the following deficits and impairments:  Abnormal  gait, Cardiopulmonary status limiting activity, Decreased balance, Decreased endurance, Decreased knowledge of use of DME, Decreased strength, Dizziness, Impaired sensation, Impaired vision/preception(using progressive lenses x 6 months "difficult to get used to" )  Visit Diagnosis: Unsteadiness on feet  Other abnormalities of gait and mobility     Problem List Patient Active Problem List   Diagnosis Date Noted  . Allergic rhinitis 01/01/2016  . Bronchiectasis without acute exacerbation ([place]) 11/03/2014  . COPD with emphysema ([place]) 11/03/2014  . Lung cancer ([place]) 11/03/2014  . Peripheral neuralgia 11/03/2014    Rexanne Mano, PT 06/29/2017, 1:15 PM  [facility] [address] [place], [place], [address] Phone: [phone]   Fax:  [phone]  Name: Bexleigh Theriault MRN: [id number] Date of Birth: [birth year]

## 2017-07-03 ENCOUNTER — Ambulatory Visit: Payer: Medicare Other | Admitting: Physical Therapy

## 2017-07-03 ENCOUNTER — Encounter: Payer: Self-pay | Admitting: Physical Therapy

## 2017-07-03 DIAGNOSIS — R42 Dizziness and giddiness: Secondary | ICD-10-CM

## 2017-07-03 DIAGNOSIS — R2681 Unsteadiness on feet: Secondary | ICD-10-CM | POA: Diagnosis not present

## 2017-07-03 DIAGNOSIS — R2689 Other abnormalities of gait and mobility: Secondary | ICD-10-CM

## 2017-07-03 NOTE — Therapy (Signed)
[facility] [address] [place], [place], [address] Phone: [phone]   Fax:  [phone]  Physical Therapy Treatment  Patient Details  Name: Jocelyn Ramirez MRN: [id number] Date of Birth: 1928-12-08 Referring Provider: Helayne Seminole, MD   Encounter Date: 07/03/2017  PT End of Session - 07/03/17 1303    Visit Number  14    Number of Visits  17    Date for PT Re-Evaluation  08/07/17    Authorization Type  Medicare; BCBS    Authorization Time Period  05/12/17  to  07/11/2017; 06/08/17 to 08/07/17    Authorization - Visit Number  5 SLP and PT visits both count    Authorization - Number of Visits  75 PT, OT, SLP combined    PT Start Time  1148    PT Stop Time  1234    PT Time Calculation (min)  46 min    Activity Tolerance  Patient tolerated treatment well    Behavior During Therapy  [facility] for tasks assessed/performed       Past Medical History:  Diagnosis Date  . COPD (chronic obstructive pulmonary disease) ([place])   . Lung cancer ([place])   . Peripheral neuropathy     Past Surgical History:  Procedure Laterality Date  . endometrial tumor    . perforated abcess on colon  1992  . RLL lobectomy  11/1992  . VAGINAL HYSTERECTOMY      There were no vitals filed for this visit.  Subjective Assessment - 07/03/17 1151    Subjective  States yesterday was a wobbly day. We discussed her use of her progressive lenses (how she takes them off and on) and how that could effect her vision. She states the glasses were not causing blurriness/"waviness" before she had vertigo but have been since (that's why she removes them).     Pertinent History  afib, OP, COPD, peripheral neuropathy (pins and needles; heavy legs), vocal fold mass, mesenteric mass (new, following)    Patient Stated Goals  I want to find out if I need this stick because of the mental side of things or if I really need it.     Currently in Pain?  No/denies          [facility] PT Assessment - 07/03/17 1212      Dynamic Gait Index   Level Surface  Mild Impairment 8.5    Change in Gait Speed  Mild Impairment    Gait with Horizontal Head Turns  Mild Impairment    Gait with Vertical Head Turns  Mild Impairment    Gait and Pivot Turn  Normal    Step Over Obstacle  Mild Impairment    Step Around Obstacles  Mild Impairment    Steps  Moderate Impairment    Total Score  16      Timed Up and Go Test   Normal TUG (seconds)  17.53                  [facility] Adult PT Treatment/Exercise - 07/03/17 0001      Ambulation/Gait   Ambulation/Gait Assistance  6: Modified independent (Device/Increase time);4: Min guard    Ambulation/Gait Assistance Details  with SPC modified independent; no device minguard however no overt LOB (slight drift to her left x1)    Ambulation Distance (Feet)  115 Feet 100, 115, 115    Assistive device  Straight cane;None    Gait Pattern  Step-through pattern;Decreased stride length;Decreased trunk  rotation;Wide base of support    Ambulation Surface  Indoor    Gait velocity  20/8.5=2.35    Ramp  Other (comment) minguard    Ramp Details (indicate cue type and reason)  with SPC, close guarding but no external support; pt remains very cautious and steady    Curb  4: Min assist    Curb Details (indicate cue type and reason)  with [facility], feels she needed "1 finger" of support through opposite hand; no imbalance          Balance Exercises - 07/03/17 1300      Balance Exercises: Standing   Standing Eyes Opened  Narrow base of support (BOS);Foam/compliant surface    Standing Eyes Closed  Wide (BOA);Foam/compliant surface        PT Education - 07/03/17 1302    Education provided  Yes    Education Details  see subjective re: vision; recommended she follow-up with eye physician due to change in her vision and inability to tolerate her glasses; educated on [employer] as a resource for exercise classes geared towards  seniors    Person(s) Educated  Patient    Methods  Explanation    Comprehension  Verbalized understanding          PT Long Term Goals - 07/03/17 1304      PT LONG TERM GOAL #1   Title  Patient will be independent with HEP to address decreased balance and general weakness. (Target all LTGs 06/11/17)    Time  4    Period  Weeks    Status  Achieved      PT LONG TERM GOAL #2   Title  Patient will verbalize a plan for continued community-based activity/exercise upon discharge from PT. (Target for ongoing LTGs 07/08/17)    Baseline  2/11 plan to extend PT visits; not yet appropriate    Time  4    Period  Weeks    Status  On-going      PT LONG TERM GOAL #3   Title  Patient will complete 6MWT with LRAD and goal TBA based on results.     Time  1    Period  Weeks    Status  Achieved      PT LONG TERM GOAL #4   Title  Patient will improve DGI score >=15 to indicate a lesser fall risk.     Baseline  05/12/17 12/24;  2/11  14/24;  07/03/17  16/24    Time  4    Period  Weeks    Status  Achieved      PT LONG TERM GOAL #5   Title  Patient will complete TUG in <=15 seconds to indicate lesser fall risk.     Baseline  05/12/17  18.10 sec (with SBQC); 2/11 20.28 (with SPC); 07/03/17 17.53 sec    Time  4    Period  Weeks    Status  Partially Met      PT LONG TERM GOAL #6   Title  Patient will increase her 6MWT distance by 150 ft with SaO2 >90% to demonstrate improved safety with return to exercise in the community.     Baseline  2/11 incr by 245 ft; SaO2 dropped to 82%    Time  4    Period  Weeks    Status  On-going            Plan - 07/03/17 1307    Clinical Impression Statement  Session included beginning to check her LTGs as patient will complete her course of PT next week. Measures had improved with full summary to be completed once all goals assessed. Discussed the role her vision with her progressive lenses could be playing in her feeling off balance and pt agreed to see her eye  doctor to discuss the changes she has noticed since having vertigo. Balance training continues and discussed potential use of [facility] upon discharge.     Rehab Potential  Good    Clinical Impairments Affecting Rehab Potential  cardiopulmonary status, neuropathy    PT Frequency  2x / week    PT Duration  4 weeks    PT Treatment/Interventions  ADLs/Self Care Home Management;DME Instruction;Gait training;Stair training;Balance training;Therapeutic exercise;Therapeutic activities;Functional mobility training;Neuromuscular re-education;Patient/family education;Vestibular;Visual/perceptual remediation/compensation    PT Next Visit Plan  ?Nustep to warm-up; Finish checking LTGs and ?need Friday appointment; Continue gait with SPC, including ramp (work towards outdoors as able) corner balance exercises for multi-sensory challenges; dynamic balance activities including on compliant surfaces(?activities in kitchen on red mat)    Consulted and Agree with Plan of Care  Patient       Patient will benefit from skilled therapeutic intervention in order to improve the following deficits and impairments:  Abnormal gait, Cardiopulmonary status limiting activity, Decreased balance, Decreased endurance, Decreased knowledge of use of DME, Decreased strength, Dizziness, Impaired sensation, Impaired vision/preception(using progressive lenses x 6 months "difficult to get used to" )  Visit Diagnosis: Unsteadiness on feet  Other abnormalities of gait and mobility  Dizziness and giddiness     Problem List Patient Active Problem List   Diagnosis Date Noted  . Allergic rhinitis 01/01/2016  . Bronchiectasis without acute exacerbation ([place]) 11/03/2014  . COPD with emphysema ([place]) 11/03/2014  . Lung cancer ([place]) 11/03/2014  . Peripheral neuralgia 11/03/2014    Rexanne Mano, PT 07/03/2017, 1:12 PM  [facility] [address] [place],  [place], [address] Phone: [phone]   Fax:  [phone]  Name: Jocelyn Ramirez MRN: [id number] Date of Birth: 1928-09-06

## 2017-07-06 ENCOUNTER — Encounter: Payer: Self-pay | Admitting: Physical Therapy

## 2017-07-06 ENCOUNTER — Ambulatory Visit: Payer: Medicare Other | Admitting: Physical Therapy

## 2017-07-06 DIAGNOSIS — R2681 Unsteadiness on feet: Secondary | ICD-10-CM | POA: Diagnosis not present

## 2017-07-06 DIAGNOSIS — R42 Dizziness and giddiness: Secondary | ICD-10-CM | POA: Diagnosis not present

## 2017-07-06 DIAGNOSIS — R2689 Other abnormalities of gait and mobility: Secondary | ICD-10-CM

## 2017-07-06 NOTE — Therapy (Signed)
[facility] [address] [place], [place], [address] Phone: [phone]   Fax:  [phone]  Physical Therapy Treatment  Patient Details  Name: Jocelyn Ramirez MRN: [id number] Date of Birth: [birth year] Referring Provider: Helayne Seminole, MD   Encounter Date: 07/06/2017  PT End of Session - 07/06/17 1937    Visit Number  15    Number of Visits  17    Date for PT Re-Evaluation  08/07/17    Authorization Type  Medicare; BCBS    Authorization Time Period  05/12/17  to  07/11/2017; 06/08/17 to 08/07/17    Authorization - Visit Number  77 SLP and PT visits both count    Authorization - Number of Visits  75 PT, OT, SLP combined    PT Start Time  1147    PT Stop Time  1235    PT Time Calculation (min)  48 min    Activity Tolerance  Patient tolerated treatment well    Behavior During Therapy  [facility] for tasks assessed/performed       Past Medical History:  Diagnosis Date  . COPD (chronic obstructive pulmonary disease) ([place])   . Lung cancer ([place])   . Peripheral neuropathy     Past Surgical History:  Procedure Laterality Date  . endometrial tumor    . perforated abcess on colon  1992  . RLL lobectomy  11/1992  . VAGINAL HYSTERECTOMY      There were no vitals filed for this visit.  Subjective Assessment - 07/06/17 1152    Subjective  Went to the new [employer] theater to see the opera this past weekend and then out to dinner. She took her [facility] and did well throughout. Never felt like "I can't do this" including curbs and slope into the theater.   (Pended)     Pertinent History  afib, OP, COPD, peripheral neuropathy (pins and needles; heavy legs), vocal fold mass, mesenteric mass (new, following)  (Pended)     Patient Stated Goals  I want to find out if I need this stick because of the mental side of things or if I really need it.   (Pended)     Currently in Pain?  No/denies  (Pended)           Treatment-Patient with  questions re: HEP (which exercises to continue, how many times per week and how many reps per set). Again showed her at the beginning of the booklet that the exercises are to be done a minimum of 3x/wk. Exercises with a distance or number of laps were confusing for her and changed these to 20 or 30 steps (depending on the difficulty of the exercise). Reviewed entire [place] program she was assigned.   Ambulation- Pt using SPC while carrying SBQC into clinic. She is more cautious and hesitant with her gait due to incr neuropathic pain in bil LEs this date. 100 ft x 2 and unable to progress to no device this date due to antalgic gait pattern.   Patient also with questions re: the timing of her last PT visit. She expected to be able to "make up" the session she missed due to the snow/ice. Explained how Medicare looks at the # of weeks and not the individual sessions. She verbalized disappointment and asked multiple follow-up questions in her attempt to understand why her next visit will be her final visit.  PT Education - 07/06/17 1221    Education provided  Yes    Education Details  reviewed her entire HEP to assure it is still challenging and answer her questions re: # repetitions    Person(s) Educated  Patient    Methods  Explanation;Handout    Comprehension  Verbalized understanding          PT Long Term Goals - 07/03/17 1304      PT LONG TERM GOAL #1   Title  Patient will be independent with HEP to address decreased balance and general weakness. (Target all LTGs 06/11/17)    Time  4    Period  Weeks    Status  Achieved      PT LONG TERM GOAL #2   Title  Patient will verbalize a plan for continued community-based activity/exercise upon discharge from PT. (Target for ongoing LTGs 07/08/17)    Baseline  2/11 plan to extend PT visits; not yet appropriate    Time  4    Period  Weeks    Status  On-going      PT LONG TERM GOAL #3   Title  Patient will  complete 6MWT with LRAD and goal TBA based on results.     Time  1    Period  Weeks    Status  Achieved      PT LONG TERM GOAL #4   Title  Patient will improve DGI score >=15 to indicate a lesser fall risk.     Baseline  05/12/17 12/24;  2/11  14/24;  07/03/17  16/24    Time  4    Period  Weeks    Status  Achieved      PT LONG TERM GOAL #5   Title  Patient will complete TUG in <=15 seconds to indicate lesser fall risk.     Baseline  05/12/17  18.10 sec (with SBQC); 2/11 20.28 (with SPC); 07/03/17 17.53 sec    Time  4    Period  Weeks    Status  Partially Met      PT LONG TERM GOAL #6   Title  Patient will increase her 6MWT distance by 150 ft with SaO2 >90% to demonstrate improved safety with return to exercise in the community.     Baseline  2/11 incr by 245 ft; SaO2 dropped to 82%    Time  4    Period  Weeks    Status  On-going            Plan - 07/06/17 1941    Clinical Impression Statement  Patient reporting her neuropathy is worse today due to being off schedule with her medication. She brought her HEP booklet with questions re: which exercises she needed to continue, how many times per week, and how many reps per set. Again showed her at the beginning of the booklet that the exercises are to be done a minimum of 3x/wk. Exercises with a distance or number of laps were confusing for her and changed these to 20 or 30 steps (depending on the difficulty of the exercise).     Rehab Potential  Good    Clinical Impairments Affecting Rehab Potential  cardiopulmonary status, neuropathy    PT Frequency  2x / week    PT Duration  4 weeks    PT Treatment/Interventions  ADLs/Self Care Home Management;DME Instruction;Gait training;Stair training;Balance training;Therapeutic exercise;Therapeutic activities;Functional mobility training;Neuromuscular re-education;Patient/family education;Vestibular;Visual/perceptual remediation/compensation    PT Next Visit Plan  ?[place]  to warm-up; Finish  checking LTGs and ?need Friday appointment; Continue gait with SPC, including ramp (work towards outdoors as able) corner balance exercises for multi-sensory challenges; dynamic balance activities including on compliant surfaces(?activities in kitchen on red mat)    Consulted and Agree with Plan of Care  Patient       Patient will benefit from skilled therapeutic intervention in order to improve the following deficits and impairments:  Abnormal gait, Cardiopulmonary status limiting activity, Decreased balance, Decreased endurance, Decreased knowledge of use of DME, Decreased strength, Dizziness, Impaired sensation, Impaired vision/preception(using progressive lenses x 6 months "difficult to get used to" )  Visit Diagnosis: Other abnormalities of gait and mobility  Unsteadiness on feet     Problem List Patient Active Problem List   Diagnosis Date Noted  . Allergic rhinitis 01/01/2016  . Bronchiectasis without acute exacerbation ([place]) 11/03/2014  . COPD with emphysema ([place]) 11/03/2014  . Lung cancer ([place]) 11/03/2014  . Peripheral neuralgia 11/03/2014    Rexanne Mano, PT 07/06/2017, 7:57 PM  [facility] [address] [place], [place], [address] Phone: [phone]   Fax:  [phone]  Name: Jocelyn Ramirez MRN: [id number] Date of Birth: March 09, 1929

## 2017-07-08 DIAGNOSIS — J984 Other disorders of lung: Secondary | ICD-10-CM | POA: Diagnosis not present

## 2017-07-08 DIAGNOSIS — I700 Atherosclerosis of aorta: Secondary | ICD-10-CM | POA: Diagnosis not present

## 2017-07-08 DIAGNOSIS — R1909 Other intra-abdominal and pelvic swelling, mass and lump: Secondary | ICD-10-CM | POA: Diagnosis not present

## 2017-07-08 DIAGNOSIS — K689 Other disorders of retroperitoneum: Secondary | ICD-10-CM | POA: Diagnosis not present

## 2017-07-08 DIAGNOSIS — N281 Cyst of kidney, acquired: Secondary | ICD-10-CM | POA: Diagnosis not present

## 2017-07-08 DIAGNOSIS — J479 Bronchiectasis, uncomplicated: Secondary | ICD-10-CM | POA: Diagnosis not present

## 2017-07-08 DIAGNOSIS — I70209 Unspecified atherosclerosis of native arteries of extremities, unspecified extremity: Secondary | ICD-10-CM | POA: Diagnosis not present

## 2017-07-10 ENCOUNTER — Encounter: Payer: Self-pay | Admitting: Physical Therapy

## 2017-07-10 ENCOUNTER — Ambulatory Visit: Payer: Medicare Other | Admitting: Physical Therapy

## 2017-07-10 DIAGNOSIS — R2689 Other abnormalities of gait and mobility: Secondary | ICD-10-CM | POA: Diagnosis not present

## 2017-07-10 DIAGNOSIS — R42 Dizziness and giddiness: Secondary | ICD-10-CM | POA: Diagnosis not present

## 2017-07-10 DIAGNOSIS — R2681 Unsteadiness on feet: Secondary | ICD-10-CM | POA: Diagnosis not present

## 2017-07-10 NOTE — Therapy (Signed)
[facility] [address] [place], [place], [address] Phone: [phone]   Fax:  [phone]  Physical Therapy Treatment and Discharge Summary  Patient Details  Name: Jocelyn Ramirez MRN: [id number] Date of Birth: 1929/04/27 Referring Provider: Helayne Seminole, MD   Encounter Date: 07/10/2017  PT End of Session - 07/10/17 1741    Visit Number  16    Number of Visits  17    Date for PT Re-Evaluation  08/07/17    Authorization Type  Medicare; BCBS    Authorization Time Period  05/12/17  to  07/11/2017; 06/08/17 to 08/07/17    Authorization - Visit Number  55 SLP and PT visits both count    Authorization - Number of Visits  75 PT, OT, SLP combined    PT Start Time  1102    PT Stop Time  1147    PT Time Calculation (min)  45 min    Activity Tolerance  Patient tolerated treatment well    Behavior During Therapy  [facility] for tasks assessed/performed       Past Medical History:  Diagnosis Date  . COPD (chronic obstructive pulmonary disease) ([place])   . Lung cancer ([place])   . Peripheral neuropathy     Past Surgical History:  Procedure Laterality Date  . endometrial tumor    . perforated abcess on colon  1992  . RLL lobectomy  11/1992  . VAGINAL HYSTERECTOMY      There were no vitals filed for this visit.  Subjective Assessment - 07/10/17 1107    Subjective  Did 5 errands yesterday where I had to walk into the business and did well. I used the [facility] because the sidewalks were so uneven.     Pertinent History  afib, OP, COPD, peripheral neuropathy (pins and needles; heavy legs), vocal fold mass, mesenteric mass (new, following)    Patient Stated Goals  I want to find out if I need this stick because of the mental side of things or if I really need it.     Currently in Pain?  No/denies         [facility] PT Assessment - 07/10/17 1116      Ambulation/Gait   Ambulation/Gait Assistance  6: Modified independent (Device/Increase  time)    Ambulation/Gait Assistance Details  see details 6 min walk test; also ambulated 600 feet outdoors with one loss of balance on a decline in sidewalk she did not see coming.     Assistive device  Straight cane    Gait Pattern  Step-through pattern;Decreased stride length;Decreased trunk rotation;Wide base of support    Ambulation Surface  Level;Indoor;Outdoor;Paved    Curb  -- minguard assist to descend and ascend with no LOB      6 Minute Walk- Baseline   BP (mmHg)  122/86    HR (bpm)  62    02 Sat (%RA)  91 %    Modified Borg Scale for Dyspnea  0- Nothing at all    Perceived Rate of Exertion (Borg)  7- Very, very light      6 Minute walk- Post Test   BP (mmHg)  116/82    HR (bpm)  65    02 Sat (%RA)  89 % recovered in ~30 sec    Modified Borg Scale for Dyspnea  2- Mild shortness of breath    Perceived Rate of Exertion (Borg)  15- Hard      6 minute walk test results  Aerobic Endurance Distance Walked  910    Endurance additional comments  norm for age 41 ft                               PT Long Term Goals - 07/10/17 1743      PT LONG TERM GOAL #1   Title  Patient will be independent with HEP to address decreased balance and general weakness. (Target all LTGs 06/11/17)    Time  4    Period  Weeks    Status  Achieved      PT LONG TERM GOAL #2   Title  Patient will verbalize a plan for continued community-based activity/exercise upon discharge from PT. (Target for ongoing LTGs 07/08/17)    Baseline  2/11 plan to extend PT visits; not yet appropriate; 07/10/17 patient enjoys walking outside and has lined up several friends to walk with her    Time  4    Period  Weeks    Status  Achieved      PT LONG TERM GOAL #3   Title  Patient will complete 6MWT with LRAD and goal TBA based on results.     Time  1    Period  Weeks    Status  Achieved      PT LONG TERM GOAL #4   Title  Patient will improve DGI score >=15 to indicate a lesser fall risk.      Baseline  05/12/17 12/24;  2/11  14/24;  07/03/17  16/24    Time  4    Period  Weeks    Status  Achieved      PT LONG TERM GOAL #5   Title  Patient will complete TUG in <=15 seconds to indicate lesser fall risk.     Baseline  05/12/17  18.10 sec (with SBQC); 2/11 20.28 (with SPC); 07/03/17 17.53 sec    Time  4    Period  Weeks    Status  Partially Met      PT LONG TERM GOAL #6   Title  Patient will increase her 6MWT distance by 150 ft with SaO2 >90% to demonstrate improved safety with return to exercise in the community.     Baseline  2/11 incr by 245 ft; SaO2 dropped to 82%; 3/15 distance slightly less than at 4 week goal check; SaO2 89% for ~30 seconds and returned to 90% and ultimately 92%    Time  4    Period  Weeks    Status  Partially Met            Plan - 07/10/17 1745    Clinical Impression Statement  Completed checking LTGs with patient meeting 4 of 6 goals and partially meeting the remaining 2 goals (demonstrated progress, but not to intended goal level). Discussed again the importance of continued exercise/activity and patient pleased to reports she has at least 4 friends scheduled to go walking with her when she discharges from PT. Overall pt has done very well. She understands that she will forever be most safe with use of an assistive device due to her peripheral neuropathy. Patient pleased with progress and agrees with discharge from PT.     Rehab Potential  Good    Clinical Impairments Affecting Rehab Potential  cardiopulmonary status, neuropathy    PT Frequency  2x / week    PT Duration  4 weeks    PT Treatment/Interventions  ADLs/Self Care Home Management;DME Instruction;Gait training;Stair training;Balance training;Therapeutic exercise;Therapeutic activities;Functional mobility training;Neuromuscular re-education;Patient/family education;Vestibular;Visual/perceptual remediation/compensation    Consulted and Agree with Plan of Care  Patient       Patient will  benefit from skilled therapeutic intervention in order to improve the following deficits and impairments:  Abnormal gait, Cardiopulmonary status limiting activity, Decreased balance, Decreased endurance, Decreased knowledge of use of DME, Decreased strength, Dizziness, Impaired sensation, Impaired vision/preception(using progressive lenses x 6 months "difficult to get used to" )  Visit Diagnosis: Other abnormalities of gait and mobility  Unsteadiness on feet     Problem List Patient Active Problem List   Diagnosis Date Noted  . Allergic rhinitis 01/01/2016  . Bronchiectasis without acute exacerbation ([place]) 11/03/2014  . COPD with emphysema ([place]) 11/03/2014  . Lung cancer ([place]) 11/03/2014  . Peripheral neuralgia 11/03/2014   PHYSICAL THERAPY DISCHARGE SUMMARY  Visits from Start of Care: 16   Current functional level related to goals / functional outcomes: Modified independent in community (including curbs) with SBQC; modified independent with SPC in community except does not feel comfortable doing curbs with SPC by herself   Remaining deficits: Imbalance due to decr sensation/LE neuropathy   Education / Equipment: See HEP  Plan: Patient agrees to discharge.  Patient goals were partially met. Patient is being discharged due to being pleased with the current functional level.  ?????        Rexanne Mano, PT 07/10/2017, 5:53 PM  [facility] [address] [place], [place], [address] Phone: [phone]   Fax:  [phone]  Name: Raeana Blinn MRN: [id number] Date of Birth: 09-Sep-1928

## 2017-07-13 ENCOUNTER — Ambulatory Visit: Payer: Medicare Other | Admitting: Pulmonary Disease

## 2017-07-13 DIAGNOSIS — K639 Disease of intestine, unspecified: Secondary | ICD-10-CM | POA: Diagnosis not present

## 2017-07-27 ENCOUNTER — Ambulatory Visit (INDEPENDENT_AMBULATORY_CARE_PROVIDER_SITE_OTHER): Payer: Medicare Other | Admitting: Pulmonary Disease

## 2017-07-27 VITALS — BP 114/68 | HR 63 | Ht 63.0 in | Wt 143.0 lb

## 2017-07-27 DIAGNOSIS — J479 Bronchiectasis, uncomplicated: Secondary | ICD-10-CM

## 2017-07-27 DIAGNOSIS — R490 Dysphonia: Secondary | ICD-10-CM

## 2017-07-27 NOTE — Progress Notes (Signed)
Subjective:    Patient ID: Jocelyn Ramirez, female    DOB: [birth year], 84 y.o.   MRN: [id number]  Synopsis: First came to the [facility] in 2016 for evaluation of idiopathic bronchiectasis. S/p RUL resection for lung cancer in the 1990's. She was previously seen by a pulmonologist named Dr. Glade Lloyd in [facility].   HPI Chief Complaint  Patient presents with  . Follow-up    pt's main complaint is hoarseness.    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  Fortunately the mass has decreased in size and her urologist feels it is benign.  He told her that he thought it came from direct trauma to her low back.  She recalled an incident at the chiropracter in 2018 where she had some sudden severe pain after a manipulation.   She says that the CT showed some bronchiectasis.    No recent bronchitis or colds or flu since the lat visit.  She says she has been cougihng a bit more lately which she attributes to the seasonal allergies.  She is not taking a nasal steroid.   She is still following with ENT and they have considered injecting her vocal cords to help.  She is going to consider this later this week.    Past Medical History:  Diagnosis Date  . COPD (chronic obstructive pulmonary disease) ([place])   . Lung cancer ([place])   . Peripheral neuropathy       Review of Systems  Constitutional: Negative for chills, fatigue and fever.  HENT: Negative for rhinorrhea, sinus pressure and sneezing.   Respiratory: Positive for cough and shortness of breath. Negative for wheezing.   Cardiovascular: Positive for leg swelling. Negative for chest pain and palpitations.       Objective:   Physical Exam  Vitals:   07/27/17 1204  BP: 114/68  Pulse: 63  SpO2: 95%  Weight: 143 lb (64.9 kg)  Height: 5\' 3"  (1.6 m)   RA  Gen: well appearing HENT: OP clear, TM's clear, neck supple PULM: Wheezing bilaterally B, normal percussion CV: RRR, no mgr, trace  edema GI: BS+, soft, nontender Derm: no cyanosis or rash Psyche: normal mood and affect   Chest imaging: 2016 CT chest images personally reviewed showing emphysema and lower lobe bronchiectasis with scattered nodules. Overall apperance suggestive of MAI     Assessment & Plan:  Hoarseness  Bronchiectasis without complication ([place])   Discussion: This is been a stable interval for Jocelyn Ramirez.  She has well-controlled bronchiectasis and centrilobular emphysema.  She does well with Advair twice a day.  She has not had any exacerbations since the last visit.  Plan: Bronchiectasis: Continue taking Advair twice a day Use albuterol as needed for chest tightness wheezing or shortness of breath Please let me know if you have a respiratory infection like bronchitis as would like to see you here sooner Otherwise, we will plan on seeing you back in 6 months or sooner if needed   Current Outpatient Medications:  .  ADVAIR HFA 115-21 MCG/ACT inhaler, USE 2 INHALATIONS ORALLY   TWICE DAILY, Disp: 36 g, Rfl: 1 .  atorvastatin (LIPITOR) 10 MG tablet, Take 10 mg by mouth daily., Disp: , Rfl:  .  bimatoprost (LUMIGAN) 0.03 % ophthalmic solution, 1 drop at bedtime., Disp: , Rfl:  .  Brinzolamide-Brimonidine (SIMBRINZA) 1-0.2 % SUSP, Apply 1 drop to eye 3 (three) times daily., Disp: , Rfl:  .  dabigatran (PRADAXA) 150 MG CAPS capsule, Take 150 mg by mouth 3 (three) times daily. , Disp: , Rfl:  .  diltiazem (CARDIZEM CD) 120 MG 24 hr capsule, Take 1 capsule by mouth daily., Disp: , Rfl:  .  EXTRA STRENGTH ACETAMINOPHEN PO, Take 1 tablet by mouth as needed., Disp: , Rfl:  .  gabapentin (NEURONTIN) 300 MG capsule, Take 2 capsules (600 mg total) by mouth 3 (three) times daily. (Patient taking differently: Take 300 mg by mouth 5 (five) times daily. ), Disp: 180 capsule, Rfl: 1 .  Multiple Vitamins-Minerals (PRESERVISION AREDS 2+MULTI VIT PO), Take 2 tablets by mouth daily., Disp: , Rfl:  .  oxybutynin  (DITROPAN) 5 MG tablet, Take 5 mg by mouth daily., Disp: , Rfl:  .  Spacer/Aero-Holding Chambers (AEROCHAMBER MV) inhaler, Use as instructed, Disp: 1 each, Rfl: 0 .  timolol (TIMOPTIC) 0.25 % ophthalmic solution, Place 1 drop into both eyes 2 (two) times daily., Disp: , Rfl:

## 2017-07-27 NOTE — Patient Instructions (Signed)
Bronchiectasis: Continue taking Advair twice a day Use albuterol as needed for chest tightness wheezing or shortness of breath Please let me know if you have a respiratory infection like bronchitis as would like to see you here sooner Otherwise, we will plan on seeing you back in 6 months or sooner if needed

## 2017-08-12 DIAGNOSIS — I480 Paroxysmal atrial fibrillation: Secondary | ICD-10-CM | POA: Diagnosis not present

## 2017-08-12 DIAGNOSIS — K639 Disease of intestine, unspecified: Secondary | ICD-10-CM | POA: Diagnosis not present

## 2017-08-12 DIAGNOSIS — J479 Bronchiectasis, uncomplicated: Secondary | ICD-10-CM | POA: Diagnosis not present

## 2017-08-12 DIAGNOSIS — Z85118 Personal history of other malignant neoplasm of bronchus and lung: Secondary | ICD-10-CM | POA: Diagnosis not present

## 2017-08-12 DIAGNOSIS — Z23 Encounter for immunization: Secondary | ICD-10-CM | POA: Diagnosis not present

## 2017-08-12 DIAGNOSIS — M792 Neuralgia and neuritis, unspecified: Secondary | ICD-10-CM | POA: Diagnosis not present

## 2017-09-28 DIAGNOSIS — E785 Hyperlipidemia, unspecified: Secondary | ICD-10-CM | POA: Diagnosis not present

## 2017-09-28 DIAGNOSIS — I482 Chronic atrial fibrillation: Secondary | ICD-10-CM | POA: Diagnosis not present

## 2017-09-28 DIAGNOSIS — J42 Unspecified chronic bronchitis: Secondary | ICD-10-CM | POA: Diagnosis not present

## 2017-10-19 DIAGNOSIS — R42 Dizziness and giddiness: Secondary | ICD-10-CM | POA: Diagnosis not present

## 2017-12-03 ENCOUNTER — Emergency Department (HOSPITAL_COMMUNITY): Payer: Medicare Other

## 2017-12-03 ENCOUNTER — Other Ambulatory Visit: Payer: Self-pay

## 2017-12-03 ENCOUNTER — Emergency Department (HOSPITAL_COMMUNITY)
Admission: EM | Admit: 2017-12-03 | Discharge: 2017-12-03 | Disposition: A | Discharge status: Home / Self Care | Payer: Medicare Other | Attending: Emergency Medicine | Admitting: Emergency Medicine

## 2017-12-03 ENCOUNTER — Encounter (HOSPITAL_COMMUNITY): Payer: Self-pay

## 2017-12-03 DIAGNOSIS — Z7901 Long term (current) use of anticoagulants: Secondary | ICD-10-CM | POA: Insufficient documentation

## 2017-12-03 DIAGNOSIS — Z85118 Personal history of other malignant neoplasm of bronchus and lung: Secondary | ICD-10-CM | POA: Diagnosis not present

## 2017-12-03 DIAGNOSIS — H539 Unspecified visual disturbance: Secondary | ICD-10-CM | POA: Insufficient documentation

## 2017-12-03 DIAGNOSIS — R410 Disorientation, unspecified: Secondary | ICD-10-CM | POA: Insufficient documentation

## 2017-12-03 DIAGNOSIS — R42 Dizziness and giddiness: Secondary | ICD-10-CM | POA: Diagnosis not present

## 2017-12-03 DIAGNOSIS — I4891 Unspecified atrial fibrillation: Secondary | ICD-10-CM | POA: Insufficient documentation

## 2017-12-03 DIAGNOSIS — Z87891 Personal history of nicotine dependence: Secondary | ICD-10-CM | POA: Insufficient documentation

## 2017-12-03 DIAGNOSIS — J449 Chronic obstructive pulmonary disease, unspecified: Secondary | ICD-10-CM | POA: Diagnosis not present

## 2017-12-03 DIAGNOSIS — Z79899 Other long term (current) drug therapy: Secondary | ICD-10-CM | POA: Insufficient documentation

## 2017-12-03 DIAGNOSIS — R0902 Hypoxemia: Secondary | ICD-10-CM | POA: Diagnosis not present

## 2017-12-03 LAB — CBC WITH DIFFERENTIAL/PLATELET
BASOS ABS: 0 10*3/uL (ref 0.0–0.1)
BASOS PCT: 0 %
EOS ABS: 0.1 10*3/uL (ref 0.0–0.7)
Eosinophils Relative: 2 %
HEMATOCRIT: 41.3 % (ref 36.0–46.0)
HEMOGLOBIN: 13.3 g/dL (ref 12.0–15.0)
Lymphocytes Relative: 26 %
Lymphs Abs: 1.8 10*3/uL (ref 0.7–4.0)
MCH: 28.7 pg (ref 26.0–34.0)
MCHC: 32.2 g/dL (ref 30.0–36.0)
MCV: 89 fL (ref 78.0–100.0)
MONOS PCT: 7 %
Monocytes Absolute: 0.5 10*3/uL (ref 0.1–1.0)
NEUTROS ABS: 4.4 10*3/uL (ref 1.7–7.7)
NEUTROS PCT: 65 %
Platelets: 230 10*3/uL (ref 150–400)
RBC: 4.64 MIL/uL (ref 3.87–5.11)
RDW: 13.6 % (ref 11.5–15.5)
WBC: 6.9 10*3/uL (ref 4.0–10.5)

## 2017-12-03 LAB — URINALYSIS, ROUTINE W REFLEX MICROSCOPIC
BILIRUBIN URINE: NEGATIVE
Glucose, UA: NEGATIVE mg/dL
Hgb urine dipstick: NEGATIVE
KETONES UR: NEGATIVE mg/dL
LEUKOCYTES UA: NEGATIVE
Nitrite: NEGATIVE
PROTEIN: NEGATIVE mg/dL
Specific Gravity, Urine: 1.01 (ref 1.005–1.030)
pH: 7 (ref 5.0–8.0)

## 2017-12-03 LAB — BASIC METABOLIC PANEL
ANION GAP: 9 (ref 5–15)
BUN: 13 mg/dL (ref 8–23)
CALCIUM: 9.5 mg/dL (ref 8.9–10.3)
CHLORIDE: 110 mmol/L (ref 98–111)
CO2: 25 mmol/L (ref 22–32)
CREATININE: 0.61 mg/dL (ref 0.44–1.00)
GFR calc non Af Amer: >60 mL/min (ref >60)
Glucose, Bld: 118 mg/dL — ABNORMAL HIGH (ref 70–99)
Potassium: 4.1 mmol/L (ref 3.5–5.1)
SODIUM: 144 mmol/L (ref 135–145)

## 2017-12-03 MED ORDER — SODIUM CHLORIDE 0.9 % IV BOLUS
500.0000 mL | Freq: Once | INTRAVENOUS | Status: AC
  Administered 2017-12-03: 500 mL via INTRAVENOUS

## 2017-12-03 NOTE — Discharge Instructions (Addendum)
Follow-up with your doctors as needed.  Continue to take your Antivert as needed.

## 2017-12-03 NOTE — ED Notes (Signed)
Patient transported to MRI 

## 2017-12-03 NOTE — ED Provider Notes (Signed)
[facility] DEPT Provider Note   CSN: [id number] Arrival date & time: 12/03/17  6578     History   Chief Complaint Chief Complaint  Patient presents with  . Dizziness    HPI Jocelyn Ramirez is a 84 y.o. female.  HPI Patient presents with dizziness.  States that when she tried to get up this morning from bed she felt as if something was wrong.  States she felt dizzy.  Somewhat difficult to delineate from her but it sounds as if it was more the room spinning.  States she had episode like this before diagnosed with vertigo.  States she has had Antivert for it.  States she was diagnosed with peripheral vertigo but not had an MRI.  No chest pain.  No trouble breathing.  No confusion.  States she has neuropathy in her feet.  She is on anticoagulation for A. fib.  Patient states she had to brace herself to be able to walk into here. Past Medical History:  Diagnosis Date  . COPD (chronic obstructive pulmonary disease) ([place])   . Lung cancer ([place])   . Peripheral neuropathy     Patient Active Problem List   Diagnosis Date Noted  . Allergic rhinitis 01/01/2016  . Bronchiectasis without acute exacerbation ([place]) 11/03/2014  . COPD with emphysema ([place]) 11/03/2014  . Lung cancer ([place]) 11/03/2014  . Peripheral neuralgia 11/03/2014    Past Surgical History:  Procedure Laterality Date  . endometrial tumor    . perforated abcess on colon  1992  . RLL lobectomy  11/1992  . VAGINAL HYSTERECTOMY       OB History   None      Home Medications    Prior to Admission medications   Medication Sig Start Date End Date Taking? Authorizing Provider  ADVAIR HFA 115-21 MCG/ACT inhaler USE 2 INHALATIONS ORALLY   TWICE DAILY Patient taking differently: Inhale 2 puffs into the lungs 2 (two) times daily.  04/08/17  Yes Juanito Doom, MD  atorvastatin (LIPITOR) 10 MG tablet Take 10 mg by mouth daily.   Yes [provider]  bimatoprost (LUMIGAN) 0.03 %  ophthalmic solution Place 1 drop into both eyes at bedtime.    Yes [provider]  Brinzolamide-Brimonidine (SIMBRINZA) 1-0.2 % SUSP Place 1 drop into both eyes 3 (three) times daily.    Yes [provider]  dabigatran (PRADAXA) 150 MG CAPS capsule Take 150 mg by mouth 2 (two) times daily.    Yes [provider]  diltiazem (CARDIZEM CD) 120 MG 24 hr capsule Take 120 mg by mouth daily.  03/19/14  Yes [provider]  gabapentin (NEURONTIN) 300 MG capsule Take 2 capsules (600 mg total) by mouth 3 (three) times daily. Patient taking differently: Take 300 mg by mouth 5 (five) times daily.  11/03/14  Yes Juanito Doom, MD  Multiple Vitamins-Minerals (PRESERVISION AREDS 2+MULTI VIT PO) Take 1 tablet by mouth 2 (two) times daily.    Yes [provider]  scopolamine (TRANSDERM-SCOP) 1 MG/3DAYS Place 1 patch onto the skin every three (3) days as needed. 11/27/17  Yes [provider]  Spacer/Aero-Holding Chambers (AEROCHAMBER MV) inhaler Use as instructed 01/01/16  Yes Juanito Doom, MD  timolol (TIMOPTIC) 0.25 % ophthalmic solution Place 1 drop into both eyes 2 (two) times daily.   Yes [provider]    Family History History reviewed. No pertinent family history.  Social History Social History   Tobacco Use  . Smoking  status: Former Smoker    Packs/day: 0.50    Years: 35.00    Pack years: 17.50    Types: Cigarettes    Last attempt to quit: 04/29/1983    Years since quitting: 34.6  . Smokeless tobacco: Never Used  Substance Use Topics  . Alcohol use: Not on file  . Drug use: Not on file     Allergies   Apixaban; Oxycodone; Penicillins; and Levofloxacin   Review of Systems Review of Systems  Constitutional: Negative for appetite change.  HENT: Negative for congestion.   Eyes: Positive for visual disturbance.       Patient has chronic difficulty seeing out of her right eye.  Respiratory: Negative for shortness of  breath.   Cardiovascular: Negative for chest pain.  Gastrointestinal: Negative for abdominal pain.  Genitourinary: Negative for hematuria.  Musculoskeletal: Negative for back pain.  Skin: Negative for rash.  Neurological: Positive for dizziness and light-headedness.  Psychiatric/Behavioral: Positive for confusion.     Physical Exam Updated Vital Signs BP (!) 143/88 (BP Location: Right Arm)   Pulse 76   Temp 98.5 F (36.9 C) (Oral)   Resp 18   Ht 5\' 3"  (1.6 m)   Wt 63.5 kg   SpO2 94%   BMI 24.80 kg/m   Physical Exam  Constitutional: She is oriented to person, place, and time. She appears well-developed.  Eyes: EOM are normal.  Neck: Neck supple.  Cardiovascular: Normal rate.  Pulmonary/Chest: Effort normal.  Musculoskeletal: She exhibits no tenderness.  Neurological: She is alert and oriented to person, place, and time.  Good grip strength bilaterally.  Finger-nose intact bilaterally.  Good straight leg raise bilaterally.  Patient is awake and mildly slow to answer but otherwise appropriate.  Skin: Skin is warm.     ED Treatments / Results  Labs (all labs ordered are listed, but only abnormal results are displayed) Labs Reviewed  BASIC METABOLIC PANEL - Abnormal; Notable for the following components:      Result Value   Glucose, Bld 118 (*)    All other components within normal limits  URINALYSIS, ROUTINE W REFLEX MICROSCOPIC  CBC WITH DIFFERENTIAL/PLATELET    EKG None  Radiology Dg Chest 2 View  Result Date: 12/03/2017 CLINICAL DATA:  84 year old female with a history dizziness EXAM: CHEST - 2 VIEW COMPARISON:  None. FINDINGS: Cardiomediastinal silhouette unchanged in size and contour. Slight right rotation, accentuating the right heart border. Stigmata of emphysema, with increased retrosternal airspace, flattened hemidiaphragms, increased AP diameter, and hyperinflation on the AP view. Coarsened interstitial markings throughout, similar to prior. No new  confluent airspace disease. No pleural effusion. No pneumothorax. Osteopenia.  No acute fracture IMPRESSION: Chronic lung changes and emphysema without evidence of acute cardiopulmonary disease Electronically Signed   By: Corrie Mckusick D.O.   On: 12/03/2017 11:21   Mr Brain Wo Contrast  Result Date: 12/03/2017 CLINICAL DATA:  Dizziness upon awakening. Alert and oriented. No neurologic deficit is reported. EXAM: MRI HEAD WITHOUT CONTRAST TECHNIQUE: Multiplanar, multiecho pulse sequences of the brain and surrounding structures were obtained without intravenous contrast. COMPARISON:  None. FINDINGS: Brain: No evidence for acute infarction, hemorrhage, mass lesion, hydrocephalus, or extra-axial fluid. Generalized atrophy. Mild to moderate subcortical and periventricular T2 and FLAIR hyperintensities, likely chronic microvascular ischemic change. There are chronic areas of lacunar type infarction affecting the RIGHT thalamus, basal ganglia, and RIGHT greater than LEFT cerebellum. Vascular: Dolichoectasia of the cerebral vessels. This is most prominent in the RIGHT ICA. Both vertebral arteries  are patent, LEFT dominant. Skull and upper cervical spine: No worrisome osseous lesion. No upper cervical compressive lesion. No tonsillar herniation. Normal pituitary. Sinuses/Orbits: No significant sinus or mastoid disease. BILATERAL cataract extraction Other: None. IMPRESSION: Atrophy and small vessel disease. Evidence of remote chronic ischemia, including previous lacunar insults involving the posterior circulation. No acute intracranial findings are evident. Within limits for detection with routine MR, no intracranial large vessel occlusion. Electronically Signed   By: Staci Righter M.D.   On: 12/03/2017 13:33    Procedures Procedures (including critical care time)  Medications Ordered in ED Medications  sodium chloride 0.9 % bolus 500 mL (0 mLs Intravenous Stopped 12/03/17 1455)     Initial Impression / Assessment  and Plan / ED Course  I have reviewed the triage vital signs and the nursing notes.  Pertinent labs & imaging results that were available during my care of the patient were reviewed by me and considered in my medical decision making (see chart for details).     Patient presents with unsteadiness.  States seems like it may be vertiginous.  States she felt things moving around.  Was not orthostatic.  States began while she was still laying down.  States has had previous vertigo.  Given small fluid bolus.  History of A. fib on anticoagulation.  MRI done due to stroke risk factors.  Shows some old strokes but no new stroke.  Will discharge home.  Able to ambulate now. Final Clinical Impressions(s) / ED Diagnoses   Final diagnoses:  Vertigo    ED Discharge Orders    None       Davonna Belling, MD 12/03/17 1500

## 2017-12-03 NOTE — ED Triage Notes (Signed)
Pt arrived via EMS from Home. Pt reports upon awaking this morning she felt dizzy and felt like she might fall and called EMS. Pt is alert and oriented x 4 and is verbally responsive. Pt lives alone. Pt has HX of Vertigo, COPD.   EMS v/s 148/76 HR 88, RR 18, 96% RA

## 2017-12-24 DIAGNOSIS — H401133 Primary open-angle glaucoma, bilateral, severe stage: Secondary | ICD-10-CM | POA: Diagnosis not present

## 2017-12-24 DIAGNOSIS — H353132 Nonexudative age-related macular degeneration, bilateral, intermediate dry stage: Secondary | ICD-10-CM | POA: Diagnosis not present

## 2017-12-24 DIAGNOSIS — H33199 Other retinoschisis and retinal cysts, unspecified eye: Secondary | ICD-10-CM | POA: Diagnosis not present

## 2017-12-30 ENCOUNTER — Encounter (INDEPENDENT_AMBULATORY_CARE_PROVIDER_SITE_OTHER): Payer: Medicare Other | Admitting: Ophthalmology

## 2018-01-03 ENCOUNTER — Emergency Department (HOSPITAL_COMMUNITY): Payer: Medicare Other

## 2018-01-03 ENCOUNTER — Encounter (HOSPITAL_COMMUNITY): Payer: Self-pay | Admitting: Emergency Medicine

## 2018-01-03 ENCOUNTER — Observation Stay (HOSPITAL_COMMUNITY)
Admission: EM | Admit: 2018-01-03 | Discharge: 2018-01-05 | Disposition: A | Discharge status: Home / Self Care | Payer: Medicare Other | Attending: Internal Medicine | Admitting: Internal Medicine

## 2018-01-03 ENCOUNTER — Other Ambulatory Visit: Payer: Self-pay

## 2018-01-03 DIAGNOSIS — Z902 Acquired absence of lung [part of]: Secondary | ICD-10-CM | POA: Diagnosis not present

## 2018-01-03 DIAGNOSIS — R4182 Altered mental status, unspecified: Secondary | ICD-10-CM | POA: Diagnosis not present

## 2018-01-03 DIAGNOSIS — I10 Essential (primary) hypertension: Secondary | ICD-10-CM | POA: Diagnosis not present

## 2018-01-03 DIAGNOSIS — G459 Transient cerebral ischemic attack, unspecified: Secondary | ICD-10-CM | POA: Diagnosis not present

## 2018-01-03 DIAGNOSIS — D72829 Elevated white blood cell count, unspecified: Secondary | ICD-10-CM | POA: Diagnosis not present

## 2018-01-03 DIAGNOSIS — I4891 Unspecified atrial fibrillation: Secondary | ICD-10-CM | POA: Diagnosis not present

## 2018-01-03 DIAGNOSIS — Z85118 Personal history of other malignant neoplasm of bronchus and lung: Secondary | ICD-10-CM | POA: Insufficient documentation

## 2018-01-03 DIAGNOSIS — Z7951 Long term (current) use of inhaled steroids: Secondary | ICD-10-CM | POA: Diagnosis not present

## 2018-01-03 DIAGNOSIS — R4701 Aphasia: Secondary | ICD-10-CM | POA: Diagnosis not present

## 2018-01-03 DIAGNOSIS — J479 Bronchiectasis, uncomplicated: Secondary | ICD-10-CM | POA: Diagnosis not present

## 2018-01-03 DIAGNOSIS — J439 Emphysema, unspecified: Secondary | ICD-10-CM | POA: Insufficient documentation

## 2018-01-03 DIAGNOSIS — R0902 Hypoxemia: Secondary | ICD-10-CM | POA: Diagnosis not present

## 2018-01-03 DIAGNOSIS — Z7901 Long term (current) use of anticoagulants: Secondary | ICD-10-CM | POA: Diagnosis not present

## 2018-01-03 DIAGNOSIS — Z87891 Personal history of nicotine dependence: Secondary | ICD-10-CM | POA: Diagnosis not present

## 2018-01-03 DIAGNOSIS — R4789 Other speech disturbances: Secondary | ICD-10-CM

## 2018-01-03 DIAGNOSIS — I4821 Permanent atrial fibrillation: Secondary | ICD-10-CM | POA: Diagnosis present

## 2018-01-03 DIAGNOSIS — R0602 Shortness of breath: Secondary | ICD-10-CM

## 2018-01-03 DIAGNOSIS — I671 Cerebral aneurysm, nonruptured: Secondary | ICD-10-CM | POA: Diagnosis present

## 2018-01-03 DIAGNOSIS — G629 Polyneuropathy, unspecified: Secondary | ICD-10-CM | POA: Diagnosis not present

## 2018-01-03 DIAGNOSIS — M792 Neuralgia and neuritis, unspecified: Secondary | ICD-10-CM | POA: Diagnosis present

## 2018-01-03 LAB — DIFFERENTIAL
Abs Immature Granulocytes: 0.1 10*3/uL (ref 0.0–0.1)
BASOS ABS: 0.1 10*3/uL (ref 0.0–0.1)
BASOS PCT: 1 %
EOS ABS: 0 10*3/uL (ref 0.0–0.7)
EOS PCT: 0 %
IMMATURE GRANULOCYTES: 1 %
Lymphocytes Relative: 11 %
Lymphs Abs: 1.9 10*3/uL (ref 0.7–4.0)
Monocytes Absolute: 1.1 10*3/uL — ABNORMAL HIGH (ref 0.1–1.0)
Monocytes Relative: 6 %
NEUTROS PCT: 81 %
Neutro Abs: 14.4 10*3/uL — ABNORMAL HIGH (ref 1.7–7.7)

## 2018-01-03 LAB — URINALYSIS, ROUTINE W REFLEX MICROSCOPIC
Bilirubin Urine: NEGATIVE
GLUCOSE, UA: NEGATIVE mg/dL
Ketones, ur: NEGATIVE mg/dL
Leukocytes, UA: NEGATIVE
Nitrite: NEGATIVE
Protein, ur: NEGATIVE mg/dL
Specific Gravity, Urine: 1.01 (ref 1.005–1.030)
pH: 9 — ABNORMAL HIGH (ref 5.0–8.0)

## 2018-01-03 LAB — COMPREHENSIVE METABOLIC PANEL
ALT: 12 U/L (ref 0–44)
AST: 22 U/L (ref 15–41)
Albumin: 3.6 g/dL (ref 3.5–5.0)
Alkaline Phosphatase: 54 U/L (ref 38–126)
Anion gap: 11 (ref 5–15)
BUN: 16 mg/dL (ref 8–23)
CALCIUM: 9.6 mg/dL (ref 8.9–10.3)
CHLORIDE: 106 mmol/L (ref 98–111)
CO2: 22 mmol/L (ref 22–32)
CREATININE: 0.78 mg/dL (ref 0.44–1.00)
Glucose, Bld: 168 mg/dL — ABNORMAL HIGH (ref 70–99)
Potassium: 4.2 mmol/L (ref 3.5–5.1)
SODIUM: 139 mmol/L (ref 135–145)
Total Bilirubin: 1.1 mg/dL (ref 0.3–1.2)
Total Protein: 6.4 g/dL — ABNORMAL LOW (ref 6.5–8.1)

## 2018-01-03 LAB — RAPID URINE DRUG SCREEN, HOSP PERFORMED
Amphetamines: NOT DETECTED
Barbiturates: NOT DETECTED
Benzodiazepines: NOT DETECTED
Cocaine: NOT DETECTED
Opiates: NOT DETECTED
Tetrahydrocannabinol: NOT DETECTED

## 2018-01-03 LAB — CBC
HCT: 39.9 % (ref 36.0–46.0)
Hemoglobin: 12.6 g/dL (ref 12.0–15.0)
MCH: 28.6 pg (ref 26.0–34.0)
MCHC: 31.6 g/dL (ref 30.0–36.0)
MCV: 90.7 fL (ref 78.0–100.0)
Platelets: 223 10*3/uL (ref 150–400)
RBC: 4.4 MIL/uL (ref 3.87–5.11)
RDW: 14.3 % (ref 11.5–15.5)
WBC: 17.6 10*3/uL — AB (ref 4.0–10.5)

## 2018-01-03 LAB — PROTIME-INR
INR: 1.24
Prothrombin Time: 15.5 seconds — ABNORMAL HIGH (ref 11.4–15.2)

## 2018-01-03 LAB — I-STAT CHEM 8, ED
BUN: 18 mg/dL (ref 8–23)
CHLORIDE: 106 mmol/L (ref 98–111)
Calcium, Ion: 1.21 mmol/L (ref 1.15–1.40)
Creatinine, Ser: 0.6 mg/dL (ref 0.44–1.00)
GLUCOSE: 161 mg/dL — AB (ref 70–99)
HCT: 39 % (ref 36.0–46.0)
Hemoglobin: 13.3 g/dL (ref 12.0–15.0)
POTASSIUM: 3.9 mmol/L (ref 3.5–5.1)
SODIUM: 140 mmol/L (ref 135–145)
TCO2: 22 mmol/L (ref 22–32)

## 2018-01-03 LAB — APTT: APTT: 32 s (ref 24–36)

## 2018-01-03 LAB — I-STAT TROPONIN, ED: TROPONIN I, POC: 0.02 ng/mL (ref 0.00–0.08)

## 2018-01-03 LAB — ETHANOL

## 2018-01-03 LAB — CBG MONITORING, ED: GLUCOSE-CAPILLARY: 155 mg/dL — AB (ref 70–99)

## 2018-01-03 MED ORDER — IOPAMIDOL (ISOVUE-370) INJECTION 76%
100.0000 mL | Freq: Once | INTRAVENOUS | Status: AC | PRN
  Administered 2018-01-03: 100 mL via INTRAVENOUS

## 2018-01-03 NOTE — Consult Note (Signed)
NEURO HOSPITALIST CONSULT NOTE   Requestig physician: Dr. Sabra Heck  Reason for Consult: Aphasia  History obtained from:  Chart and Communication with Dr. Sabra Heck  HPI:                                                                                                                                          Jocelyn Ramirez is an 84 y.o. female with atrial fibrillation, on Pradaxa, presenting from home via EMS with altered mental status. Her most prominent symptom is difficulty with speech and trouble putting sentences together. LKN was 1200. She was diagnosed with a stroke 2 weeks ago and did not have residual deficits per report. She is alert and answers orientation questions hesitantly but correctly. Has trouble with comprehending several questions and commands. CBG was 155.    Her PMHx includes lung CA s/p RLL lobectomy, COPD and peripheral neuropathy. She was seen in th eED last month with a c/c of vertigo.   EKG reveals atrial fibrillation.   Home medications include Pradaxa 150 mg BID.   Past Medical History:  Diagnosis Date  . COPD (chronic obstructive pulmonary disease) ([place])   . Lung cancer ([place])   . Peripheral neuropathy     Past Surgical History:  Procedure Laterality Date  . endometrial tumor    . perforated abcess on colon  1992  . RLL lobectomy  11/1992  . VAGINAL HYSTERECTOMY      History reviewed. No pertinent family history.           Social History:  reports that she quit smoking about 34 years ago. Her smoking use included cigarettes. She has a 17.50 pack-year smoking history. She has never used smokeless tobacco. Her alcohol and drug histories are not on file.  Allergies  Allergen Reactions  . Apixaban Nausea And Vomiting  . Oxycodone Nausea And Vomiting  . Penicillins Other (See Comments)    Unknown reaction Has patient had a PCN reaction causing immediate rash, facial/tongue/throat swelling, SOB or lightheadedness with hypotension:  Unknown Has patient had a PCN reaction causing severe rash involving mucus membranes or skin necrosis: Unknown Has patient had a PCN reaction that required hospitalization: Unknown Has patient had a PCN reaction occurring within the last 10 years: Unknown If all of the above answers are "NO", then may proceed with Cephalosporin use.   . Levofloxacin Rash    HOME MEDICATIONS:  ROS:                                                                                                                                       As per HPI. Detailed ROS deferred in the context of acuity of presentation.    Blood pressure (!) 153/88, pulse 93, temperature 99.7 F (37.6 C), temperature source Oral, resp. rate (!) 22, height 5\' 3"  (1.6 m), weight 64 kg, SpO2 96 %.   General Examination:                                                                                                       Physical Exam  HEENT-  [place]/AT   Lungs- Respirations unlabored Extremities- No edema  Neurological Examination Mental Status: Alert, oriented to self, place and day of week, then perseverates prior answers to two other questions. No dysarthria. Has a hesitant quality to her speech with some long pauses for word finding. Able to name some objects but difficulty with others. Had difficulty with a 2-step directional command. Difficulty with repetition. Some answers with nonsensical word-salad quality, most others with intact grammar and syntax. . Cranial Nerves: II: Visual fields grossly normal  III,IV, VI: EOMI without nystagmus. PERRL.  VII: No facial droop.  VIII: Hearing intact to voice IX,X: No hypophonia or hoarseness XI: Head at midline XII: No lingual dysarthria Motor: Moves all extremities equally with 5/5 strength proximally and distally Sensory: FT decreased to LUE and LLE. Intermittent left  sided extinction also noted. Appears confused when asked to say if she can feel a cold stimulus applied to forearms.  Deep Tendon Reflexes: 2+ and symmetric throughout Plantars: Right: downgoing   Left: downgoing Cerebellar: No ataxia with FNF bilaterally.  Gait: Deferred   Lab Results: Basic Metabolic Panel: Recent Labs  Lab 01/03/18 2039 01/03/18 2129  NA 139 140  K 4.2 3.9  CL 106 106  CO2 22  --   GLUCOSE 168* 161*  BUN 16 18  CREATININE 0.78 0.60  CALCIUM 9.6  --     CBC: Recent Labs  Lab 01/03/18 2039 01/03/18 2129  WBC 17.6*  --   HGB 12.6 13.3  HCT 39.9 39.0  MCV 90.7  --   PLT 223  --     Cardiac Enzymes: No results for input(s): CKTOTAL, CKMB, CKMBINDEX, TROPONINI in the last 168 hours.  Lipid Panel: No results for input(s): CHOL, TRIG, HDL, CHOLHDL, VLDL, LDLCALC in the last 168 hours.  Imaging: No results found.  Assessment: 84 year old female with acute onset of dysphasia and confusion 1. Speech findings on exam best localizable as either a left perisylvian cortex lesion secondary to ischemic stroke, or diffuse bihemispheric dysfunction due to toxic/metabolic or infectious etiology.  2. CT head shows no intracranial hemorrhage.  ASPECTS is 10. 3. CTA of head and neck reveals no emergent large vessel occlusion or hemodynamically significant stenosis of the head or neck. 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  4. No ischemia demonstrated by CT perfusion analysis.  Recommendations: 1. MRI brain. 2. TTE 3. Toxic/metabolic/infectious work up.  4. PT/OT/Speech consults 5. Continue Pradaxa 6. Cardiac telemetry.  7. Permissive HTN 8. Benefits of statin most likely outweighed by risks given advanced age  Electronically signed: Dr. Kerney Elbe 01/03/2018, 10:16 PM

## 2018-01-03 NOTE — ED Triage Notes (Signed)
Per EMS, pt is coming from home where pt's son stated that pt has become more altered and has had difficulty speaking and trouble putting sentences together. Pt last known normal was 1200. Pt was dx with stroke 2 weeks ago with no deficients. A&Ox3.

## 2018-01-03 NOTE — ED Provider Notes (Signed)
[facility] EMERGENCY DEPARTMENT Provider Note   CSN: [id number] Arrival date & time: 01/03/18  2031     History   Chief Complaint Chief Complaint  Patient presents with  . Altered Mental Status  . Code Stroke    HPI Jocelyn Ramirez is a 84 y.o. female.  HPI  84 year old female, she has a known history of COPD as well as paroxysmal atrial fibrillation, currently on Pradaxa, diltiazem, albuterol inhaler.  She has a prior history of a stroke but only as far as MRI showed from a visit from 1 month ago when she had vertigo.  She presents with her grandson who is the primary historian today reporting that he was called from the patient's friends with whom she was spending time stating that something happened and her speech was off, he reports the onset to be likely 4:00 PM based on the report of the friends were not here at this time.  The patient reports that she does have difficulty with word finding, she does not have any other significant difficulties except for feeling like her legs feel like heavy wooden logs.  Level 5 caveat applies secondary to the difficulty of the patient's ability to communicate most of the information was taken from the family member  Past Medical History:  Diagnosis Date  . COPD (chronic obstructive pulmonary disease) ([place])   . Lung cancer ([place])   . Peripheral neuropathy     Patient Active Problem List   Diagnosis Date Noted  . Allergic rhinitis 01/01/2016  . Bronchiectasis without acute exacerbation ([place]) 11/03/2014  . 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 Lung cancer ([place]) 11/03/2014  . Peripheral neuralgia 11/03/2014    Past Surgical History:  Procedure Laterality Date  . endometrial tumor    . perforated abcess on colon  1992  . RLL lobectomy  11/1992  . VAGINAL HYSTERECTOMY       OB History   None      Home Medications    Prior to Admission medications   Medication Sig Start Date End Date Taking? Authorizing  Provider  ADVAIR HFA 115-21 MCG/ACT inhaler USE 2 INHALATIONS ORALLY   TWICE DAILY Patient taking differently: Inhale 2 puffs into the lungs 2 (two) times daily.  04/08/17  Yes Juanito Doom, MD  atorvastatin (LIPITOR) 10 MG tablet Take 10 mg by mouth daily.   Yes [provider]  bimatoprost (LUMIGAN) 0.03 % ophthalmic solution Place 1 drop into both eyes at bedtime.    Yes [provider]  Brinzolamide-Brimonidine (SIMBRINZA) 1-0.2 % SUSP Place 1 drop into both eyes 3 (three) times daily.    Yes [provider]  dabigatran (PRADAXA) 150 MG CAPS capsule Take 150 mg by mouth 2 (two) times daily.    Yes [provider]  diltiazem (CARDIZEM CD) 120 MG 24 hr capsule Take 120 mg by mouth 2 (two) times daily.  03/19/14  Yes [provider]  gabapentin (NEURONTIN) 300 MG capsule Take 2 capsules (600 mg total) by mouth 3 (three) times daily. Patient taking differently: Take 300 mg by mouth 5 (five) times daily.  11/03/14  Yes Juanito Doom, MD  ibuprofen (ADVIL,MOTRIN) 200 MG tablet Take 200 mg by mouth every 6 (six) hours as needed for fever, headache, mild pain or moderate pain.   Yes [provider]  meclizine (ANTIVERT) 12.5 MG tablet Take 25 mg by mouth daily as needed. 12/16/17  Yes [provider]  scopolamine (TRANSDERM-SCOP) 1 MG/3DAYS  Place 1 patch onto the skin every three (3) days as needed. 11/27/17  Yes [provider]  Spacer/Aero-Holding Chambers (AEROCHAMBER MV) inhaler Use as instructed 01/01/16  Yes Juanito Doom, MD  timolol (TIMOPTIC) 0.25 % ophthalmic solution Place 1 drop into both eyes 2 (two) times daily.   Yes [provider]  Multiple Vitamins-Minerals (PRESERVISION AREDS 2+MULTI VIT PO) Take 1 tablet by mouth 2 (two) times daily.     [provider]    Family History History reviewed. No pertinent family history.  Social History Social History   Tobacco Use  . Smoking  status: Former Smoker    Packs/day: 0.50    Years: 35.00    Pack years: 17.50    Types: Cigarettes    Last attempt to quit: 04/29/1983    Years since quitting: 34.7  . Smokeless tobacco: Never Used  Substance Use Topics  . Alcohol use: Not on file  . Drug use: Not on file     Allergies   Apixaban; Oxycodone; Penicillins; and Levofloxacin   Review of Systems Review of Systems  Unable to perform ROS: Mental status change     Physical Exam Updated Vital Signs BP (!) 142/92   Pulse 88   Temp 99.7 F (37.6 C) (Oral)   Resp 17   Ht 1.6 m (5\' 3" )   Wt 64 kg   SpO2 97%   BMI 24.98 kg/m   Physical Exam  Constitutional: She appears well-developed and well-nourished. No distress.  HENT:  Head: Normocephalic and atraumatic.  Mouth/Throat: Oropharynx is clear and moist. No oropharyngeal exudate.  Eyes: Pupils are equal, round, and reactive to light. Conjunctivae and EOM are normal. Right eye exhibits no discharge. Left eye exhibits no discharge. No scleral icterus.  Neck: Normal range of motion. Neck supple. No JVD present. No thyromegaly present.  Cardiovascular: Normal rate, normal heart sounds and intact distal pulses. Exam reveals no gallop and no friction rub.  No murmur heard. Atrial fibrillation, normal pulses at the bilateral radial arteries as well as the dorsalis pedis bilaterally  Pulmonary/Chest: Effort normal and breath sounds normal. No respiratory distress. She has no wheezes. She has no rales.  Abdominal: Soft. Bowel sounds are normal. She exhibits no distension and no mass. There is no tenderness.  Musculoskeletal: Normal range of motion. She exhibits no edema or tenderness.  Lymphadenopathy:    She has no cervical adenopathy.  Neurological: She is alert. Coordination normal.  The patient is able to perform the activities including finger-nose-finger, heel shin, rapid alternating movements, there is no pronator drift, she has normal grips bilaterally, she can  straight leg raise bilaterally, there is no focal weakness or numbness.  Cranial nerves III through XII are intact including her peripheral visual fields which are normal.  She has difficulty with word finding but is not slurring her speech.  Skin: Skin is warm and dry. No rash noted. No erythema.  Psychiatric: She has a normal mood and affect. Her behavior is normal.  Nursing note and vitals reviewed.    ED Treatments / Results  Labs (all labs ordered are listed, but only abnormal results are displayed) Labs Reviewed  COMPREHENSIVE METABOLIC PANEL - Abnormal; Notable for the following components:      Result Value   Glucose, Bld 168 (*)    Total Protein 6.4 (*)    All other components within normal limits  CBC - Abnormal; Notable for the following components:   WBC 17.6 (*)  All other components within normal limits  PROTIME-INR - Abnormal; Notable for the following components:   Prothrombin Time 15.5 (*)    All other components within normal limits  URINALYSIS, ROUTINE W REFLEX MICROSCOPIC - Abnormal; Notable for the following components:   pH 9.0 (*)    Hgb urine dipstick SMALL (*)    Bacteria, UA RARE (*)    All other components within normal limits  DIFFERENTIAL - Abnormal; Notable for the following components:   Neutro Abs 14.4 (*)    Monocytes Absolute 1.1 (*)    All other components within normal limits  CBG MONITORING, ED - Abnormal; Notable for the following components:   Glucose-Capillary 155 (*)    All other components within normal limits  I-STAT CHEM 8, ED - Abnormal; Notable for the following components:   Glucose, Bld 161 (*)    All other components within normal limits  ETHANOL  APTT  RAPID URINE DRUG SCREEN, HOSP PERFORMED  I-STAT TROPONIN, ED    EKG EKG Interpretation  Date/Time:  Sunday January 03 2018 20:41:13 EDT Ventricular Rate:  91 PR Interval:    QRS Duration: 109 QT Interval:  328 QTC Calculation: 404 R Axis:   94 Text  Interpretation:  Atrial fibrillation Anterior infarct, old Borderline repolarization abnormality No old tracing to compare Confirmed by Noemi Chapel [phone]) on 01/03/2018 9:18:20 PM   Radiology Ct Angio Head W Or Wo Contrast  Result Date: 01/03/2018 CLINICAL DATA:  Aphasia EXAM: CT HEAD CODE STROKE WITHOUT CONTRAST CT ANGIOGRAPHY HEAD AND NECK CT PERFUSION BRAIN TECHNIQUE: Multidetector CT imaging of the head and neck was performed using the standard protocol during bolus administration of intravenous contrast. Multiplanar CT image reconstructions and MIPs were obtained to evaluate the vascular anatomy. Carotid stenosis measurements (when applicable) are obtained utilizing NASCET criteria, using the distal internal carotid diameter as the denominator. Multiphase CT imaging of the brain was performed before and following IV bolus contrast injection. Subsequent parametric perfusion maps were calculated using RAPID software. CONTRAST:  159mL ISOVUE-370 IOPAMIDOL (ISOVUE-370) INJECTION 76% COMPARISON:  None. FINDINGS: CT HEAD FINDINGS Brain: There is no mass, hemorrhage or extra-axial collection. There is generalized atrophy without lobar predilection. There are old right gangliothalamic lacunar infarcts. There is hypoattenuation of the periventricular white matter, most commonly indicating chronic ischemic microangiopathy. Skull: The visualized skull base, calvarium and extracranial soft tissues are normal. Sinuses/Orbits: No fluid levels or advanced mucosal thickening of the visualized paranasal sinuses. No mastoid or middle ear effusion. The orbits are normal. ASPECTS ([place] Stroke Program Early CT Score) - Ganglionic level infarction (caudate, lentiform nuclei, internal capsule, insula, M1-M3 cortex): 7 - Supraganglionic infarction (M4-M6 cortex): 3 Total score (0-10 with 10 being normal): 10 Review of the MIP images confirms the above findings CTA NECK FINDINGS AORTIC ARCH: There is mild calcific  atherosclerosis of the aortic arch. There is no aneurysm, dissection or hemodynamically significant stenosis of the visualized ascending aorta and aortic arch. Conventional 3 vessel aortic branching pattern. The visualized proximal subclavian arteries are widely patent. RIGHT CAROTID SYSTEM: --Common carotid artery: Widely patent origin without common carotid artery dissection or aneurysm. --Internal carotid artery: No dissection, occlusion or aneurysm. No hemodynamically significant stenosis. --External carotid artery: No acute abnormality. LEFT CAROTID SYSTEM: --Common carotid artery: Widely patent origin without common carotid artery dissection or aneurysm. --Internal carotid artery:No dissection, occlusion or aneurysm. No hemodynamically significant stenosis. --External carotid artery: No acute abnormality. VERTEBRAL ARTERIES: Left dominant configuration. Both origins are normal. No dissection, occlusion  or flow-limiting stenosis to the vertebrobasilar confluence. SKELETON: There is no bony spinal canal stenosis. No lytic or blastic lesion. OTHER NECK: Normal pharynx, larynx and major salivary glands. No cervical lymphadenopathy. Unremarkable thyroid gland. UPPER CHEST: No pneumothorax or pleural effusion. No nodules or masses. 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 There is atherosclerotic calcification of both intracranial internal carotid arteries without hemodynamically significant stenosis. --Anterior cerebral arteries: Normal. Absent left A1 segment, normal variant. A-comm is patent. --Middle cerebral arteries: Normal. --Posterior communicating arteries: Absent bilaterally. POSTERIOR CIRCULATION: --Basilar artery: Normal. --Posterior cerebral arteries: Normal. --Superior cerebellar  arteries: Normal. --Inferior cerebellar arteries: Normal anterior and posterior inferior cerebellar arteries. VENOUS SINUSES: As permitted by contrast timing, patent. ANATOMIC VARIANTS: None DELAYED PHASE: Not performed. Review of the MIP images confirms the above findings. CT Brain Perfusion Findings: CBF (<30%) Volume: 40mL Perfusion (Tmax>6.0s) volume: 94mL Mismatch Volume: 31mL Infarction Location:None IMPRESSION: 1. No intracranial hemorrhage.  ASPECTS is 10. 2. No emergent large vessel occlusion or hemodynamically significant stenosis of the head or neck. 3. 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 4. No ischemia demonstrated by CT perfusion analysis. These results were called by telephone at the time of interpretation on 01/03/2018 at 10:40 pm to Dr. Kerney Elbe, who verbally acknowledged these results. Electronically Signed   By: Ulyses Jarred M.D.   On: 01/03/2018 22:52   Ct Angio Neck W Or Wo Contrast  Result Date: 01/03/2018 CLINICAL DATA:  Aphasia EXAM: CT HEAD CODE STROKE WITHOUT CONTRAST CT ANGIOGRAPHY HEAD AND NECK CT PERFUSION BRAIN TECHNIQUE: Multidetector CT imaging of the head and neck was performed using the standard protocol during bolus administration of intravenous contrast. Multiplanar CT image reconstructions and MIPs were obtained to evaluate the vascular anatomy. Carotid stenosis measurements (when applicable) are obtained utilizing NASCET criteria, using the distal internal carotid diameter as the denominator. Multiphase CT imaging of the brain was performed before and following IV bolus contrast injection. Subsequent parametric perfusion maps were calculated using RAPID software. CONTRAST:  157mL ISOVUE-370 IOPAMIDOL (ISOVUE-370) INJECTION 76% COMPARISON:  None. FINDINGS: CT HEAD FINDINGS Brain: There is no mass, hemorrhage or extra-axial collection. There is generalized atrophy without lobar  predilection. There are old right gangliothalamic lacunar infarcts. There is hypoattenuation of the periventricular white matter, most commonly indicating chronic ischemic microangiopathy. Skull: The visualized skull base, calvarium and extracranial soft tissues are normal. Sinuses/Orbits: No fluid levels or advanced mucosal thickening of the visualized paranasal sinuses. No mastoid or middle ear effusion. The orbits are normal. ASPECTS ([place] Stroke Program Early CT Score) - Ganglionic level infarction (caudate, lentiform nuclei, internal capsule, insula, M1-M3 cortex): 7 - Supraganglionic infarction (M4-M6 cortex): 3 Total score (0-10 with 10 being normal): 10 Review of the MIP images confirms the above findings CTA NECK FINDINGS AORTIC ARCH: There is mild calcific atherosclerosis of the aortic arch. There is no aneurysm, dissection or hemodynamically significant stenosis of the visualized ascending aorta and aortic arch. Conventional 3 vessel aortic branching pattern. The visualized proximal subclavian arteries are widely patent. RIGHT CAROTID SYSTEM: --Common carotid artery: Widely patent origin without common carotid artery dissection or aneurysm. --Internal carotid artery: No dissection, occlusion or aneurysm. No hemodynamically significant stenosis. --External carotid artery: No acute  abnormality. LEFT CAROTID SYSTEM: --Common carotid artery: Widely patent origin without common carotid artery dissection or aneurysm. --Internal carotid artery:No dissection, occlusion or aneurysm. No hemodynamically significant stenosis. --External carotid artery: No acute abnormality. VERTEBRAL ARTERIES: Left dominant configuration. Both origins are normal. No dissection, occlusion or flow-limiting stenosis to the vertebrobasilar confluence. SKELETON: There is no bony spinal canal stenosis. No lytic or blastic lesion. OTHER NECK: Normal pharynx, larynx and major salivary glands. No cervical lymphadenopathy. Unremarkable  thyroid gland. UPPER CHEST: No pneumothorax or pleural effusion. No nodules or masses. 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 There is atherosclerotic calcification of both intracranial internal carotid arteries without hemodynamically significant stenosis. --Anterior cerebral arteries: Normal. Absent left A1 segment, normal variant. A-comm is patent. --Middle cerebral arteries: Normal. --Posterior communicating arteries: Absent bilaterally. POSTERIOR CIRCULATION: --Basilar artery: Normal. --Posterior cerebral arteries: Normal. --Superior cerebellar arteries: Normal. --Inferior cerebellar arteries: Normal anterior and posterior inferior cerebellar arteries. VENOUS SINUSES: As permitted by contrast timing, patent. ANATOMIC VARIANTS: None DELAYED PHASE: Not performed. Review of the MIP images confirms the above findings. CT Brain Perfusion Findings: CBF (<30%) Volume: 94mL Perfusion (Tmax>6.0s) volume: 9mL Mismatch Volume: 98mL Infarction Location:None IMPRESSION: 1. No intracranial hemorrhage.  ASPECTS is 10. 2. No emergent large vessel occlusion or hemodynamically significant stenosis of the head or neck. 3. 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 4. No ischemia demonstrated by CT perfusion analysis. These results were called by telephone at the time of interpretation on 01/03/2018 at 10:40 pm to Dr. Kerney Elbe, who verbally acknowledged these results. Electronically Signed   By: Ulyses Jarred M.D.   On: 01/03/2018 22:52   Ct Cerebral Perfusion W Contrast  Result Date: 01/03/2018 CLINICAL DATA:  Aphasia EXAM: CT HEAD CODE STROKE WITHOUT CONTRAST CT ANGIOGRAPHY HEAD AND NECK CT PERFUSION BRAIN TECHNIQUE:  Multidetector CT imaging of the head and neck was performed using the standard protocol during bolus administration of intravenous contrast. Multiplanar CT image reconstructions and MIPs were obtained to evaluate the vascular anatomy. Carotid stenosis measurements (when applicable) are obtained utilizing NASCET criteria, using the distal internal carotid diameter as the denominator. Multiphase CT imaging of the brain was performed before and following IV bolus contrast injection. Subsequent parametric perfusion maps were calculated using RAPID software. CONTRAST:  171mL ISOVUE-370 IOPAMIDOL (ISOVUE-370) INJECTION 76% COMPARISON:  None. FINDINGS: CT HEAD FINDINGS Brain: There is no mass, hemorrhage or extra-axial collection. There is generalized atrophy without lobar predilection. There are old right gangliothalamic lacunar infarcts. There is hypoattenuation of the periventricular white matter, most commonly indicating chronic ischemic microangiopathy. Skull: The visualized skull base, calvarium and extracranial soft tissues are normal. Sinuses/Orbits: No fluid levels or advanced mucosal thickening of the visualized paranasal sinuses. No mastoid or middle ear effusion. The orbits are normal. ASPECTS ([place] Stroke Program Early CT Score) - Ganglionic level infarction (caudate, lentiform nuclei, internal capsule, insula, M1-M3 cortex): 7 - Supraganglionic infarction (M4-M6 cortex): 3 Total score (0-10 with 10 being normal): 10 Review of the MIP images confirms the above findings CTA NECK FINDINGS AORTIC ARCH: There is mild calcific atherosclerosis of the aortic arch. There is no aneurysm, dissection or hemodynamically significant stenosis of the visualized ascending aorta and aortic arch. Conventional 3 vessel  aortic branching pattern. The visualized proximal subclavian arteries are widely patent. RIGHT CAROTID SYSTEM: --Common carotid artery: Widely patent origin without common carotid artery dissection or aneurysm.  --Internal carotid artery: No dissection, occlusion or aneurysm. No hemodynamically significant stenosis. --External carotid artery: No acute abnormality. LEFT CAROTID SYSTEM: --Common carotid artery: Widely patent origin without common carotid artery dissection or aneurysm. --Internal carotid artery:No dissection, occlusion or aneurysm. No hemodynamically significant stenosis. --External carotid artery: No acute abnormality. VERTEBRAL ARTERIES: Left dominant configuration. Both origins are normal. No dissection, occlusion or flow-limiting stenosis to the vertebrobasilar confluence. SKELETON: There is no bony spinal canal stenosis. No lytic or blastic lesion. OTHER NECK: Normal pharynx, larynx and major salivary glands. No cervical lymphadenopathy. Unremarkable thyroid gland. UPPER CHEST: No pneumothorax or pleural effusion. No nodules or masses. 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 There is atherosclerotic calcification of both intracranial internal carotid arteries without hemodynamically significant stenosis. --Anterior cerebral arteries: Normal. Absent left A1 segment, normal variant. A-comm is patent. --Middle cerebral arteries: Normal. --Posterior communicating arteries: Absent bilaterally. POSTERIOR CIRCULATION: --Basilar artery: Normal. --Posterior cerebral arteries: Normal. --Superior cerebellar arteries: Normal. --Inferior cerebellar arteries: Normal anterior and posterior inferior cerebellar arteries. VENOUS SINUSES: As permitted by contrast timing, patent. ANATOMIC VARIANTS: None DELAYED PHASE: Not performed. Review of the MIP images confirms the above findings. CT Brain Perfusion Findings: CBF (<30%) Volume: 57mL Perfusion (Tmax>6.0s) volume: 6mL Mismatch Volume: 78mL Infarction  Location:None IMPRESSION: 1. No intracranial hemorrhage.  ASPECTS is 10. 2. No emergent large vessel occlusion or hemodynamically significant stenosis of the head or neck. 3. 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 4. No ischemia demonstrated by CT perfusion analysis. These results were called by telephone at the time of interpretation on 01/03/2018 at 10:40 pm to Dr. Kerney Elbe, who verbally acknowledged these results. Electronically Signed   By: Ulyses Jarred M.D.   On: 01/03/2018 22:52   Ct Head Code Stroke Wo Contrast`  Result Date: 01/03/2018 CLINICAL DATA:  Aphasia EXAM: CT HEAD CODE STROKE WITHOUT CONTRAST CT ANGIOGRAPHY HEAD AND NECK CT PERFUSION BRAIN TECHNIQUE: Multidetector CT imaging of the head and neck was performed using the standard protocol during bolus administration of intravenous contrast. Multiplanar CT image reconstructions and MIPs were obtained to evaluate the vascular anatomy. Carotid stenosis measurements (when applicable) are obtained utilizing NASCET criteria, using the distal internal carotid diameter as the denominator. Multiphase CT imaging of the brain was performed before and following IV bolus contrast injection. Subsequent parametric perfusion maps were calculated using RAPID software. CONTRAST:  153mL ISOVUE-370 IOPAMIDOL (ISOVUE-370) INJECTION 76% COMPARISON:  None. FINDINGS: CT HEAD FINDINGS Brain: There is no mass, hemorrhage or extra-axial collection. There is generalized atrophy without lobar predilection. There are old right gangliothalamic lacunar infarcts. There is hypoattenuation of the periventricular white matter, most commonly indicating chronic ischemic microangiopathy. Skull: The visualized skull base, calvarium and extracranial soft tissues are normal. Sinuses/Orbits: No fluid levels or advanced mucosal thickening of the visualized paranasal sinuses. No mastoid or middle ear  effusion. The orbits are normal. ASPECTS [facility] Stroke Program Early CT Score) - Ganglionic level infarction (caudate, lentiform nuclei, internal capsule, insula, M1-M3 cortex): 7 - Supraganglionic infarction (M4-M6 cortex): 3 Total score (0-10 with 10  being normal): 10 Review of the MIP images confirms the above findings CTA NECK FINDINGS AORTIC ARCH: There is mild calcific atherosclerosis of the aortic arch. There is no aneurysm, dissection or hemodynamically significant stenosis of the visualized ascending aorta and aortic arch. Conventional 3 vessel aortic branching pattern. The visualized proximal subclavian arteries are widely patent. RIGHT CAROTID SYSTEM: --Common carotid artery: Widely patent origin without common carotid artery dissection or aneurysm. --Internal carotid artery: No dissection, occlusion or aneurysm. No hemodynamically significant stenosis. --External carotid artery: No acute abnormality. LEFT CAROTID SYSTEM: --Common carotid artery: Widely patent origin without common carotid artery dissection or aneurysm. --Internal carotid artery:No dissection, occlusion or aneurysm. No hemodynamically significant stenosis. --External carotid artery: No acute abnormality. VERTEBRAL ARTERIES: Left dominant configuration. Both origins are normal. No dissection, occlusion or flow-limiting stenosis to the vertebrobasilar confluence. SKELETON: There is no bony spinal canal stenosis. No lytic or blastic lesion. OTHER NECK: Normal pharynx, larynx and major salivary glands. No cervical lymphadenopathy. Unremarkable thyroid gland. UPPER CHEST: No pneumothorax or pleural effusion. No nodules or masses. 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 There is atherosclerotic calcification of both  intracranial internal carotid arteries without hemodynamically significant stenosis. --Anterior cerebral arteries: Normal. Absent left A1 segment, normal variant. A-comm is patent. --Middle cerebral arteries: Normal. --Posterior communicating arteries: Absent bilaterally. POSTERIOR CIRCULATION: --Basilar artery: Normal. --Posterior cerebral arteries: Normal. --Superior cerebellar arteries: Normal. --Inferior cerebellar arteries: Normal anterior and posterior inferior cerebellar arteries. VENOUS SINUSES: As permitted by contrast timing, patent. ANATOMIC VARIANTS: None DELAYED PHASE: Not performed. Review of the MIP images confirms the above findings. CT Brain Perfusion Findings: CBF (<30%) Volume: 56mL Perfusion (Tmax>6.0s) volume: 33mL Mismatch Volume: 101mL Infarction Location:None IMPRESSION: 1. No intracranial hemorrhage.  ASPECTS is 10. 2. No emergent large vessel occlusion or hemodynamically significant stenosis of the head or neck. 3. 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 4. No ischemia demonstrated by CT perfusion analysis. These results were called by telephone at the time of interpretation on 01/03/2018 at 10:40 pm to Dr. Kerney Elbe, who verbally acknowledged these results. Electronically Signed   By: Ulyses Jarred M.D.   On: 01/03/2018 22:52    Procedures Procedures (including critical care time)  Medications Ordered in ED Medications  iopamidol (ISOVUE-370) 76 % injection 100 mL (100 mLs Intravenous Contrast Given 01/03/18 2235)     Initial Impression / Assessment and Plan / ED Course  I have reviewed the triage vital signs and the nursing notes.  Pertinent labs & imaging results that were available during my care of the patient were reviewed by me and considered in my medical decision making (see chart for details).     We will consult with neuro hospitalist as possible stroke exists, she has had the  symptoms for over 5 hours at the time of my evaluation.  She will go immediately to CT scan.  Cussed with the neuro hospitalist, Dr. Cheral Marker, request code stroke be activated, this was done at 9:50 PM.  CT negative, discussed with hospitalist who will admit.  Final Clinical Impressions(s) / ED Diagnoses   Final diagnoses:  Word finding difficulty      Noemi Chapel, MD 01/03/18 2319

## 2018-01-03 NOTE — ED Notes (Signed)
ED Provider at bedside. 

## 2018-01-03 NOTE — ED Notes (Signed)
Checked CBG 155, RN Madison informed

## 2018-01-03 NOTE — ED Notes (Signed)
Pt back in room from CT 

## 2018-01-04 ENCOUNTER — Observation Stay (HOSPITAL_BASED_OUTPATIENT_CLINIC_OR_DEPARTMENT_OTHER): Payer: Medicare Other

## 2018-01-04 ENCOUNTER — Observation Stay (HOSPITAL_COMMUNITY): Payer: Medicare Other

## 2018-01-04 ENCOUNTER — Encounter (HOSPITAL_COMMUNITY): Payer: Self-pay | Admitting: Internal Medicine

## 2018-01-04 ENCOUNTER — Encounter (INDEPENDENT_AMBULATORY_CARE_PROVIDER_SITE_OTHER): Payer: Medicare Other | Admitting: Ophthalmology

## 2018-01-04 DIAGNOSIS — J439 Emphysema, unspecified: Secondary | ICD-10-CM | POA: Diagnosis not present

## 2018-01-04 DIAGNOSIS — G459 Transient cerebral ischemic attack, unspecified: Secondary | ICD-10-CM | POA: Diagnosis present

## 2018-01-04 DIAGNOSIS — I482 Chronic atrial fibrillation: Secondary | ICD-10-CM | POA: Diagnosis not present

## 2018-01-04 DIAGNOSIS — R4701 Aphasia: Secondary | ICD-10-CM | POA: Diagnosis not present

## 2018-01-04 DIAGNOSIS — I4821 Permanent atrial fibrillation: Secondary | ICD-10-CM | POA: Diagnosis present

## 2018-01-04 LAB — COMPREHENSIVE METABOLIC PANEL
ALT: 12 U/L (ref 0–44)
AST: 17 U/L (ref 15–41)
Albumin: 3.4 g/dL — ABNORMAL LOW (ref 3.5–5.0)
Alkaline Phosphatase: 58 U/L (ref 38–126)
Anion gap: 10 (ref 5–15)
BUN: 11 mg/dL (ref 8–23)
CHLORIDE: 110 mmol/L (ref 98–111)
CO2: 19 mmol/L — AB (ref 22–32)
CREATININE: 0.63 mg/dL (ref 0.44–1.00)
Calcium: 9.2 mg/dL (ref 8.9–10.3)
GFR calc Af Amer: >60 mL/min (ref >60)
Glucose, Bld: 120 mg/dL — ABNORMAL HIGH (ref 70–99)
Potassium: 3.8 mmol/L (ref 3.5–5.1)
Sodium: 139 mmol/L (ref 135–145)
Total Bilirubin: 1.2 mg/dL (ref 0.3–1.2)
Total Protein: 6.5 g/dL (ref 6.5–8.1)

## 2018-01-04 LAB — CBC
HCT: 40.2 % (ref 36.0–46.0)
HEMOGLOBIN: 12.7 g/dL (ref 12.0–15.0)
MCH: 28.2 pg (ref 26.0–34.0)
MCHC: 31.6 g/dL (ref 30.0–36.0)
MCV: 89.3 fL (ref 78.0–100.0)
PLATELETS: 199 10*3/uL (ref 150–400)
RBC: 4.5 MIL/uL (ref 3.87–5.11)
RDW: 14.5 % (ref 11.5–15.5)
WBC: 14.3 10*3/uL — AB (ref 4.0–10.5)

## 2018-01-04 LAB — ECHOCARDIOGRAM COMPLETE
Height: 63 in
WEIGHTICAEL: 2256 [oz_av]

## 2018-01-04 LAB — LIPID PANEL
CHOLESTEROL: 136 mg/dL (ref 0–200)
HDL: 58 mg/dL (ref >40)
LDL CALC: 69 mg/dL (ref 0–99)
Total CHOL/HDL Ratio: 2.3 RATIO
Triglycerides: 47 mg/dL (ref <150)
VLDL: 9 mg/dL (ref 0–40)

## 2018-01-04 MED ORDER — DABIGATRAN ETEXILATE MESYLATE 150 MG PO CAPS
150.0000 mg | ORAL_CAPSULE | Freq: Two times a day (BID) | ORAL | Status: DC
  Administered 2018-01-04 – 2018-01-05 (×4): 150 mg via ORAL
  Filled 2018-01-04 (×4): qty 1

## 2018-01-04 MED ORDER — ACETAMINOPHEN 160 MG/5ML PO SOLN: 650.0000 mg | ORAL | Status: DC | PRN

## 2018-01-04 MED ORDER — DILTIAZEM HCL ER COATED BEADS 120 MG PO CP24
120.0000 mg | ORAL_CAPSULE | Freq: Two times a day (BID) | ORAL | Status: DC
  Administered 2018-01-04 – 2018-01-05 (×3): 120 mg via ORAL
  Filled 2018-01-04 (×3): qty 1

## 2018-01-04 MED ORDER — ACETAMINOPHEN 325 MG PO TABS: 650.0000 mg | ORAL_TABLET | ORAL | Status: DC | PRN

## 2018-01-04 MED ORDER — SODIUM CHLORIDE 0.9 % IV SOLN
INTRAVENOUS | Status: AC
  Administered 2018-01-04: 03:00:00 via INTRAVENOUS

## 2018-01-04 MED ORDER — ACETAMINOPHEN 650 MG RE SUPP: 650.0000 mg | RECTAL | Status: DC | PRN

## 2018-01-04 MED ORDER — GABAPENTIN 300 MG PO CAPS
300.0000 mg | ORAL_CAPSULE | Freq: Every day | ORAL | Status: DC
  Administered 2018-01-04 – 2018-01-05 (×8): 300 mg via ORAL
  Filled 2018-01-04 (×8): qty 1

## 2018-01-04 MED ORDER — STROKE: EARLY STAGES OF RECOVERY BOOK
Freq: Once | Status: DC
  Filled 2018-01-04: qty 1

## 2018-01-04 MED ORDER — ATORVASTATIN CALCIUM 20 MG PO TABS
10.0000 mg | ORAL_TABLET | Freq: Every day | ORAL | Status: DC
  Administered 2018-01-04 – 2018-01-05 (×2): 10 mg via ORAL
  Filled 2018-01-04 (×2): qty 1

## 2018-01-04 MED ORDER — LATANOPROST 0.005 % OP SOLN
1.0000 [drp] | Freq: Every day | OPHTHALMIC | Status: DC
  Administered 2018-01-04: 1 [drp] via OPHTHALMIC
  Filled 2018-01-04: qty 2.5

## 2018-01-04 MED ORDER — MOMETASONE FURO-FORMOTEROL FUM 200-5 MCG/ACT IN AERO
2.0000 | INHALATION_SPRAY | Freq: Two times a day (BID) | RESPIRATORY_TRACT | Status: DC
  Administered 2018-01-04 – 2018-01-05 (×2): 2 via RESPIRATORY_TRACT
  Filled 2018-01-04: qty 8.8

## 2018-01-04 MED ORDER — TIMOLOL MALEATE 0.25 % OP SOLN
1.0000 [drp] | Freq: Two times a day (BID) | OPHTHALMIC | Status: DC
  Administered 2018-01-04 – 2018-01-05 (×3): 1 [drp] via OPHTHALMIC
  Filled 2018-01-04: qty 5

## 2018-01-04 NOTE — Progress Notes (Signed)
  Echocardiogram 2D Echocardiogram has been performed.  Johny Chess 01/04/2018, 12:04 PM

## 2018-01-04 NOTE — Progress Notes (Addendum)
[facility] - DAILY PROGRESS NOTE    HISTORY Jocelyn Ramirez is a 84 y.o. female with history of atrial fibrillation, lung cancer status post lobectomy in the 90s, bronchiectasis, peripheral neuropathy was brought to the ER after patient was found to be confused by the family.  As per patient's grandson who provided the history patient's neighbors noticed that patient was getting confused and not able to bring out words since yesterday 4 PM.  Patient's grandson was actually coming to meet the patient from [place].  On seeing the patient patient was found to be confused and had some difficulty bringing her worse and was brought to the ER.  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  Patient also states that she had a stroke last month but was not able to provide further history on that.  Denies any weakness of the upper or lower extremities or any difficulty swallowing.  Patient also was complaining of increasing lower extremity pain more than her usual.  SUBJECTIVE Patient state she is doing very well this am, with improvement of symptoms per family. Speech is clear with clear thoughts. No dysarthria noted and denies extremity weakness.  Patient denies any vision, nausea, vomiting, headache or dizziness.  OBJECTIVE Most recent Vital Signs: Vitals:   01/04/18 0521 01/04/18 0700 01/04/18 0800 01/04/18 0915  BP: (!) 146/76 (!) 109/98 102/66 128/76  Pulse: 86 81 96 86  Resp: (!) 26 (!) 21 20 (!) 24  Temp:    97.8 F (36.6 C)  TempSrc:    Oral  SpO2: 96% 97% 93% 96%  Weight:      Height:       CBG (last 3)  Recent Labs    01/03/18 2044  GLUCAP 155*    Physical Exam  HEENT-  Normocephalic, no lesions, without obvious abnormality.  Normal external eye and conjunctiva.  No vision in right arm Cardiovascular- irregularly irregularly rhythm, pulses palpable throughout   Lungs-no  rhonchi or wheezing noted, no excessive working breathing.  Saturations within normal limits Abdomen- All 4 quadrants palpated and nontender Musculoskeletal-no joint tenderness, deformity or swelling Skin-warm and dry, no hyperpigmentation, vitiligo, or suspicious lesions  Neuro:  Mental Status: Alert, oriented, thought content appropriate.  Speech fluent without evidence of aphasia.  Able to follow 3 step commands without difficulty. Cranial Nerves: II:  Visual fields grossly normal, right eye no vision  III,IV, VI: ptosis not present, extra-ocular motions intact bilaterally pupils equal, round, reactive to light and accommodation in right eye V,VII: smile symmetric, facial light touch sensation normal bilaterally VIII: hearing intact to voice IX,X: uvula rises symmetrically XI: bilateral shoulder shrug XII: midline tongue extension Motor: Right : Upper extremity   5/5    Left:     Upper extremity   5/5  Lower extremity   5/5     Lower extremity   5/5 Tone and bulk:normal tone throughout; no atrophy noted Sensory: Pinprick and light touch intact throughout, bilaterally Deep Tendon Reflexes: 2+ and symmetric throughout Plantars: Right: downgoing   Left: downgoing Cerebellar: normal finger-to-nose, normal rapid alternating movements and normal heel-to-shin test Gait: normal gait and station  IV Fluid Intake:   .  sodium chloride 50 mL/hr at 01/04/18 0308    MEDICATIONS  .  stroke: mapping our early stages of recovery book   Does not apply Once  . atorvastatin  10 mg Oral Daily  . dabigatran  150 mg Oral BID  . diltiazem  120 mg Oral BID  . gabapentin  300 mg Oral 5 X Daily  . latanoprost  1 drop Both Eyes QHS  . mometasone-formoterol  2 puff Inhalation BID  . timolol  1 drop Both Eyes BID   PRN:  acetaminophen **OR** acetaminophen (TYLENOL) oral liquid 160 mg/5 mL **OR** acetaminophen  Diet:   Diet Order            Diet Heart Room service appropriate? Yes; Fluid  consistency: Thin  Diet effective now               CLINICALLY SIGNIFICANT STUDIES Basic Metabolic Panel:  Recent Labs  Lab 01/03/18 2039 01/03/18 2129 01/04/18 0525  NA 139 140 139  K 4.2 3.9 3.8  CL 106 106 110  CO2 22  --  19*  GLUCOSE 168* 161* 120*  BUN 16 18 11   CREATININE 0.78 0.60 0.63  CALCIUM 9.6  --  9.2   Liver Function Tests:  Recent Labs  Lab 01/03/18 2039 01/04/18 0525  AST 22 17  ALT 12 12  ALKPHOS 54 58  BILITOT 1.1 1.2  PROT 6.4* 6.5  ALBUMIN 3.6 3.4*   CBC:  Recent Labs  Lab 01/03/18 2039 01/03/18 2116 01/03/18 2129 01/04/18 0525  WBC 17.6*  --   --  14.3*  NEUTROABS  --  14.4*  --   --   HGB 12.6  --  13.3 12.7  HCT 39.9  --  39.0 40.2  MCV 90.7  --   --  89.3  PLT 223  --   --  199   Coagulation:  Recent Labs  Lab 01/03/18 2116  LABPROT 15.5*  INR 1.24   Cardiac Enzymes: No results for input(s): CKTOTAL, CKMB, CKMBINDEX, TROPONINI in the last 168 hours. Urinalysis:  Recent Labs  Lab 01/03/18 2136  COLORURINE YELLOW  LABSPEC 1.010  PHURINE 9.0*  GLUCOSEU NEGATIVE  HGBUR SMALL*  BILIRUBINUR NEGATIVE  KETONESUR NEGATIVE  PROTEINUR NEGATIVE  NITRITE NEGATIVE  LEUKOCYTESUR NEGATIVE   Lipid Panel    Component Value Date/Time   CHOL 136 01/04/2018 0525   TRIG 47 01/04/2018 0525   HDL 58 01/04/2018 0525   CHOLHDL 2.3 01/04/2018 0525   VLDL 9 01/04/2018 0525   LDLCALC 69 01/04/2018 0525   HgbA1C No results found for: HGBA1C  Urine Drug Screen:      Component Value Date/Time   LABOPIA NONE DETECTED 01/03/2018 2136   COCAINSCRNUR NONE DETECTED 01/03/2018 2136   LABBENZ NONE DETECTED 01/03/2018 2136   AMPHETMU NONE DETECTED 01/03/2018 2136   THCU NONE DETECTED 01/03/2018 2136   LABBARB NONE DETECTED 01/03/2018 2136    Alcohol Level:  Recent Labs  Lab 01/03/18 2117  ETH <10    Ct Angio Head/Neck W Or Wo Contrast/CT perfusion  01/03/2018 IMPRESSION:  1. No intracranial hemorrhage.  ASPECTS is 10.  2. No  emergent large vessel occlusion or hemodynamically significant stenosis of the head or neck.  3. 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  4. No ischemia demonstrated by CT perfusion analysis.   Mr Brain Wo Contrast  01/04/2018 IMPRESSION:  1. No acute  intracranial abnormality.  2. Multiple old cerebellar, basal ganglia and thalamus lacunar infarcts.  3. Atrophy and chronic ischemic microangiopathy.   Dg Chest Port 1 View  01/04/2018 IMPRESSION:  1. No acute findings 2. Postsurgical change and emphysema, stable in appearance from prior exam.   Ct Head Code Stroke Wo Contrast 01/03/2018 IMPRESSION:  1. No intracranial hemorrhage.  ASPECTS is 10.  2. No emergent large vessel occlusion or hemodynamically significant stenosis of the head or neck.  3. 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  4. No ischemia demonstrated by CT perfusion analysis.   EKG Afib  Outstanding Stroke Work-up Studies:     Echocardiogram:                                                    PENDING    ASSESSMENT/PLAN Transient speech difficulty - most likely anxiety and stress related as she is about to undergo significant change of her living condition. TIA unlikely, but can not be completely ruled out due to risk factor of afib    Code Stroke: No  CTA head & neck/ CT Perfusion: No emergent large vessel occlusion or hemodynamically significant stenosis of the head or neck.  CT of the brain: No intracranial hemorrhage  MRI of the brain : No acute intracranial abnormalities. Multiple old cerebellar, basal ganglia and thalamus lacunar infarcts.  2D Echocardiogram : pending  CXR : No acute findings.   LDL : 69  HgbA1c:  pending  VTE : fully anticoagulated on Pradaxa  Continue pradaxa  Atorvastatin 10mg   Continue Rehab with PT  consult, OT consult, Speech consult  Therapy recommendations:  Home health PT  Disposition:  pending   Atrial Fibrillation  Home anticoagulation:  Pradaxa  Home medications, Cardizem  Continue pradaxa - she will follow up with her cardiologist closely  CHA2DS2-VASc Score = 6             Age in Years:  ?64                         Sex:  Female   Female                      Hypertension History:  yes                          Diabetes Mellitus:  0             Congestive Heart Failure History:  0             Vascular Disease History:  yes                             Stroke/TIA/Thromboembolism History:  yes      Hyperlipidemia  Home meds:  None  LDL 69 goal < 70  Continue Lipitor 10mg   Continue statin at discharge   Internal carotid artery aneurysm 5 mm at its base and 3.5 mm base to apex.   Out patient eval  Will refer to Dr. Estanislado Pandy   Other Stroke Risk Factors  Advanced age    Other Active Problems  Peripheral Neuropathy  COPD with bronchiectasis  Hospital day # 0  SIGNED [facility] Neuro-hospitalist Team [phone] 01/04/2018, 1:21 PM   01/04/2018 ATTENDING ASSESSMENT   ATTENDING NOTE: I reviewed above note and agree with the assessment and plan. Pt was seen and examined.   84 year old female with history of COPD, RLL lung cancer status post lobectomy, A. fib on Pradaxa admitted for incoherent speech and word finding difficulties.  CT no acute abnormality.  CTA head and neck right ICA clinoid segment 5 x 3.5 mm aneurysm.  CT perfusion negative.  MRI no acute stroke, but old right SCA and thalamic lacunar infarcts.  2D echo pending.  LDL 69 and A1c pending.  UDS negative.  UA negative.  WBC 14.3 and creatinine 0.63.  On examination, patient fluent speech and speech difficulty has resolved.  As per patient the her grandson, patient had speech difficulty from yesterday 4 PM until late last night, now resolved.  No other neurological deficit seen.  Per  patient, she was anxious and stressful yesterday as she has been living independently, and she is about to change her living condition and live with her grandson's family due to gradual mild decline in her daily activity.  However, patient still wants to live independently as she still enjoys her current active life and she does not feel decline to the point needs to live with family.   I told her that her condition most likely due to anxiety and stress with her ongoing living condition change. TIA less likely given the duration of symptoms and negative MRI. However, it can not be completely ruled out given risk factor of afib. She is compliant with pradaxa. I recommended her to make an appointment with her cardiology to schedule an early follow up to discuss further. Continue pradaxa for now. I also recommend her to relax and decrease stress level. Pt expressed understanding and appreciation.   We will refer her to Dr. Estanislado Pandy to follow up with right ICA aneurysm.   Neurology will sign off. Please call with questions. No neuro follow up needed at this time. Thanks for the consult.   Rosalin Hawking, MD PhD Stroke Neurology 01/04/2018 3:53 PM  I spent  35 minutes in total face-to-face time with the patient, more than 50% of which was spent in counseling and coordination of care, reviewing test results, images and medication, and discussing the diagnosis of anxiety, afib, stress related symptoms, treatment plan and potential prognosis. This patient's care requiresreview of multiple databases, neurological assessment, discussion with family, other specialists and medical decision making of high complexity. I had long discussion with pt and grandson at bedside, updated pt current condition, treatment plan and potential prognosis. They expressed understanding and appreciation.    To contact Stroke Continuity provider, please refer to [email or web address] After hours, contact General Neurology

## 2018-01-04 NOTE — ED Notes (Signed)
Patient transported to MRI 

## 2018-01-04 NOTE — ED Notes (Signed)
Attempted to call report

## 2018-01-04 NOTE — Evaluation (Signed)
Physical Therapy Evaluation Patient Details Name: Jocelyn Ramirez MRN: [id number] DOB: October 02, 1928 Today's Date: 01/04/2018   History of Present Illness  Pt is an 84 y/o female admitted secondary to increased confusion and word finding difficulty. Suspect TIA. 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 PMH includes a fib, lung cancer s/p lobectomy, and CVA with R eye blindness.   Clinical Impression  Pt admitted secondary to problem above with deficits below. Pt with unsteadiness and weakness, requiring min guard to min A for mobility with use of RW. Pt also presenting with some cognitive deficits. Feel pt will need 24 hour support at home at d/c, and per RN, plan is to move pt in with grandson. However, if pt does not have assist at home, may need to consider short term SNF at d/c. Will continue to follow acutely to maximize functional mobility independence and safety.     Follow Up Recommendations Home health PT;Supervision/Assistance - 24 hour    Equipment Recommendations  Rolling walker with 5" wheels    Recommendations for Other Services       Precautions / Restrictions Precautions Precautions: Fall Restrictions Weight Bearing Restrictions: No      Mobility  Bed Mobility Overal bed mobility: Needs Assistance Bed Mobility: Supine to Sit     Supine to sit: Min assist     General bed mobility comments: Min A for trunk elevation.   Transfers Overall transfer level: Needs assistance Equipment used: Rolling walker (2 wheeled) Transfers: Sit to/from Stand Sit to Stand: Min guard         General transfer comment: Min guard A for steadying assist to stand with RW. Verbal cues for safe hand placement.   Ambulation/Gait Ambulation/Gait assistance: Min guard Gait Distance (Feet): 50 Feet Assistive device: Rolling walker (2 wheeled) Gait Pattern/deviations: Step-through pattern;Decreased stride length;Drifts right/left Gait velocity:  Decreased    General Gait Details: Slow, mildly unsteady gait requiring min guard A. verbal cues for sequencing using RW. HR elevated to 128 bpm during ambulation.   Stairs            Wheelchair Mobility    Modified Rankin (Stroke Patients Only) Modified Rankin (Stroke Patients Only) Pre-Morbid Rankin Score: Slight disability Modified Rankin: Moderately severe disability     Balance Overall balance assessment: Needs assistance Sitting-balance support: No upper extremity supported;Feet supported Sitting balance-Leahy Scale: Good     Standing balance support: Bilateral upper extremity supported;During functional activity Standing balance-Leahy Scale: Poor Standing balance comment: Reliant on UE support.                              Pertinent Vitals/Pain Pain Assessment: No/denies pain    Home Living Family/patient expects to be discharged to:: Private residence Living Arrangements: Alone Available Help at Discharge: Family;Available PRN/intermittently Type of Home: Apartment Home Access: Level entry     Home Layout: One level Home Equipment: Cane - single point Additional Comments: Per RN, plan is for pt to move in with grandson where increased assist can be provided.     Prior Function Level of Independence: Independent with assistive device(s)         Comments: Reports using cane to walk outside of house and was not using AD for inside. Per RN, pt using furniture to walk inside the house.      Hand Dominance        Extremity/Trunk Assessment   Upper Extremity Assessment  Upper Extremity Assessment: Defer to OT evaluation    Lower Extremity Assessment Lower Extremity Assessment: Generalized weakness    Cervical / Trunk Assessment Cervical / Trunk Assessment: Kyphotic  Communication   Communication: No difficulties  Cognition Arousal/Alertness: Awake/alert Behavior During Therapy: WFL for tasks assessed/performed Overall Cognitive  Status: No family/caregiver present to determine baseline cognitive functioning                                 General Comments: History of previous CVA. Pt disoriented to situation. Somewhat slow processing noted as well.       General Comments General comments (skin integrity, edema, etc.): No family present, however, per RN plan is to go live with grandson at d/c.     Exercises     Assessment/Plan    PT Assessment Patient needs continued PT services  PT Problem List Decreased cognition;Decreased balance;Decreased strength;Decreased mobility;Decreased knowledge of use of DME       PT Treatment Interventions DME instruction;Gait training;Functional mobility training;Therapeutic activities;Therapeutic exercise;Balance training;Patient/family education;Cognitive remediation    PT Goals (Current goals can be found in the Care Plan section)  Acute Rehab PT Goals Patient Stated Goal: "to walk like I normally do" PT Goal Formulation: With patient Time For Goal Achievement: 01/18/18 Potential to Achieve Goals: Fair    Frequency Min 3X/week   Barriers to discharge        Co-evaluation               AM-PAC PT "6 Clicks" Daily Activity  Outcome Measure Difficulty turning over in bed (including adjusting bedclothes, sheets and blankets)?: A Little Difficulty moving from lying on back to sitting on the side of the bed? : Unable Difficulty sitting down on and standing up from a chair with arms (e.g., wheelchair, bedside commode, etc,.)?: Unable Help needed moving to and from a bed to chair (including a wheelchair)?: A Little Help needed walking in hospital room?: A Little Help needed climbing 3-5 steps with a railing? : A Lot 6 Click Score: 13    End of Session Equipment Utilized During Treatment: Gait belt Activity Tolerance: Patient tolerated treatment well Patient left: in chair;with call bell/phone within reach;with chair alarm set Nurse Communication:  Mobility status PT Visit Diagnosis: Unsteadiness on feet (R26.81);Muscle weakness (generalized) (M62.81);Other symptoms and signs involving the nervous system (R29.898)    Time: 7858-8502 PT Time Calculation (min) (ACUTE ONLY): 23 min   Charges:   PT Evaluation $PT Eval Low Complexity: 1 Low PT Treatments $Gait Training: 8-22 mins        Leighton Ruff, PT, DPT  Acute Rehabilitation Services  Pager: [phone] Office: [phone]   Rudean Hitt 01/04/2018, 1:12 PM

## 2018-01-04 NOTE — H&P (Signed)
History and Physical    Jemia Fata LKJ:[id number] DOB: [birth year] DOA: 01/03/2018  PCP: Leeroy Cha, MD  Patient coming from: Home.  Chief Complaint: Confusion and difficulty speaking.  HPI: Jocelyn Ramirez is a 84 y.o. female with history of atrial fibrillation, lung cancer status post lobectomy in the 90s, bronchiectasis, peripheral neuropathy was brought to the ER after patient was found to be confused by the family.  As per patient's grandson who provided the history patient's neighbors noticed that patient was getting confused and not able to bring out words since yesterday 4 PM.  Patient's grandson was actually coming to meet the patient from [place].  On seeing the patient patient was found to be confused and had some difficulty bringing her worse and was brought to the ER.  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  Patient also states that she had a stroke last month but was not able to provide further history on that.  Denies any weakness of the upper or lower extremities or any difficulty swallowing.  Patient also was complaining of increasing lower extremity pain more than her usual.  ED Course: In the ER patient had CT head followed by CT angiogram of the head and neck which did not show any hemorrhage or large vessel occlusion.  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  On-call neurologist Dr. Cheral Marker was consulted and patient admitted for possible TIA.  At the time of my exam patient still has some difficulty with memory and bringing out words.  Able to move all extremities without difficulty.  Patient was complaining of lower extremity pain but has no any acute ischemic changes and has good peripheral pulses.  Review of Systems: As per HPI, rest all negative.   Past Medical History:  Diagnosis Date  . COPD (chronic obstructive pulmonary disease) ([place])   . Lung cancer ([place])   . Peripheral neuropathy     Past  Surgical History:  Procedure Laterality Date  . endometrial tumor    . perforated abcess on colon  1992  . RLL lobectomy  11/1992  . VAGINAL HYSTERECTOMY       reports that she quit smoking about 34 years ago. Her smoking use included cigarettes. She has a 17.50 pack-year smoking history. She has never used smokeless tobacco. Her alcohol and drug histories are not on file.  Allergies  Allergen Reactions  . Apixaban Nausea And Vomiting  . Oxycodone Nausea And Vomiting  . Penicillins Other (See Comments)    Unknown reaction Has patient had a PCN reaction causing immediate rash, facial/tongue/throat swelling, SOB or lightheadedness with hypotension: Unknown Has patient had a PCN reaction causing severe rash involving mucus membranes or skin necrosis: Unknown Has patient had a PCN reaction that required hospitalization: Unknown Has patient had a PCN reaction occurring within the last 10 years: Unknown If all of the above answers are "NO", then may proceed with Cephalosporin use.   . Levofloxacin Rash    Family History  Problem Relation Age of Onset  . Diabetes Mellitus II Sister   . CAD Sister   . CAD Mother     Prior to Admission medications   Medication Sig Start Date End Date Taking? Authorizing Provider  ADVAIR HFA 115-21 MCG/ACT inhaler USE 2 INHALATIONS ORALLY   TWICE DAILY Patient taking differently: Inhale 2 puffs into the lungs 2 (two) times daily.  04/08/17  Yes Juanito Doom, MD  atorvastatin (LIPITOR) 10 MG tablet Take  10 mg by mouth daily.   Yes [provider]  bimatoprost (LUMIGAN) 0.03 % ophthalmic solution Place 1 drop into both eyes at bedtime.    Yes [provider]  Brinzolamide-Brimonidine (SIMBRINZA) 1-0.2 % SUSP Place 1 drop into both eyes 3 (three) times daily.    Yes [provider]  dabigatran (PRADAXA) 150 MG CAPS capsule Take 150 mg by mouth 2 (two) times daily.    Yes [provider]  diltiazem (CARDIZEM CD) 120  MG 24 hr capsule Take 120 mg by mouth 2 (two) times daily.  03/19/14  Yes [provider]  gabapentin (NEURONTIN) 300 MG capsule Take 2 capsules (600 mg total) by mouth 3 (three) times daily. Patient taking differently: Take 300 mg by mouth 5 (five) times daily.  11/03/14  Yes Juanito Doom, MD  ibuprofen (ADVIL,MOTRIN) 200 MG tablet Take 200 mg by mouth every 6 (six) hours as needed for fever, headache, mild pain or moderate pain.   Yes [provider]  meclizine (ANTIVERT) 12.5 MG tablet Take 25 mg by mouth daily as needed. 12/16/17  Yes [provider]  scopolamine (TRANSDERM-SCOP) 1 MG/3DAYS Place 1 patch onto the skin every three (3) days as needed. 11/27/17  Yes [provider]  Spacer/Aero-Holding Chambers (AEROCHAMBER MV) inhaler Use as instructed 01/01/16  Yes Juanito Doom, MD  timolol (TIMOPTIC) 0.25 % ophthalmic solution Place 1 drop into both eyes 2 (two) times daily.   Yes [provider]  Multiple Vitamins-Minerals (PRESERVISION AREDS 2+MULTI VIT PO) Take 1 tablet by mouth 2 (two) times daily.     [provider]    Physical Exam: Vitals:   01/03/18 2300 01/03/18 2315 01/03/18 2345 01/04/18 0048  BP: 131/88 (!) 146/86 (!) 145/80 (!) 142/92  Pulse: 89 97 87   Resp: (!) 26 18 18  (!) 22  Temp:      TempSrc:      SpO2: 97% 95% 99%   Weight:      Height:          Constitutional: Moderately built and nourished. Vitals:   01/03/18 2300 01/03/18 2315 01/03/18 2345 01/04/18 0048  BP: 131/88 (!) 146/86 (!) 145/80 (!) 142/92  Pulse: 89 97 87   Resp: (!) 26 18 18  (!) 22  Temp:      TempSrc:      SpO2: 97% 95% 99%   Weight:      Height:       Eyes: Anicteric no pallor.  Poor vision on the right eye. ENMT: No discharge from the ears eyes nose or mouth. Neck: No mass felt.  No neck rigidity.  No JVD appreciated. Respiratory: No rhonchi or crepitations. Cardiovascular: S1-S2 heard no murmurs appreciated. Abdomen: Soft  nontender bowel sounds present. Musculoskeletal: No edema.  No joint effusion. Skin: No rash.  Skin appears warm. Neurologic: Alert awake oriented to time place and person.  But has some difficulty with memory.  Moves all extremities 5 x 5.  Poor vision on the right eye.  No facial asymmetry.  Tongue is midline. Psychiatric: Has some difficulty with memory.   Labs on Admission: I have personally reviewed following labs and imaging studies  CBC: Recent Labs  Lab 01/03/18 2039 01/03/18 2116 01/03/18 2129  WBC 17.6*  --   --   NEUTROABS  --  14.4*  --   HGB 12.6  --  13.3  HCT 39.9  --  39.0  MCV 90.7  --   --  PLT 223  --   --    Basic Metabolic Panel: Recent Labs  Lab 01/03/18 2039 01/03/18 2129  NA 139 140  K 4.2 3.9  CL 106 106  CO2 22  --   GLUCOSE 168* 161*  BUN 16 18  CREATININE 0.78 0.60  CALCIUM 9.6  --    GFR: Estimated Creatinine Clearance: 44.6 mL/min (by C-G formula based on SCr of 0.6 mg/dL). Liver Function Tests: Recent Labs  Lab 01/03/18 2039  AST 22  ALT 12  ALKPHOS 54  BILITOT 1.1  PROT 6.4*  ALBUMIN 3.6   No results for input(s): LIPASE, AMYLASE in the last 168 hours. No results for input(s): AMMONIA in the last 168 hours. Coagulation Profile: Recent Labs  Lab 01/03/18 2116  INR 1.24   Cardiac Enzymes: No results for input(s): CKTOTAL, CKMB, CKMBINDEX, TROPONINI in the last 168 hours. BNP (last 3 results) No results for input(s): PROBNP in the last 8760 hours. HbA1C: No results for input(s): HGBA1C in the last 72 hours. CBG: Recent Labs  Lab 01/03/18 2044  GLUCAP 155*   Lipid Profile: No results for input(s): CHOL, HDL, LDLCALC, TRIG, CHOLHDL, LDLDIRECT in the last 72 hours. Thyroid Function Tests: No results for input(s): TSH, T4TOTAL, FREET4, T3FREE, THYROIDAB in the last 72 hours. Anemia Panel: No results for input(s): VITAMINB12, FOLATE, FERRITIN, TIBC, IRON, RETICCTPCT in the last 72 hours. Urine analysis:    Component  Value Date/Time   COLORURINE YELLOW 01/03/2018 2136   APPEARANCEUR CLEAR 01/03/2018 2136   LABSPEC 1.010 01/03/2018 2136   PHURINE 9.0 (H) 01/03/2018 2136   GLUCOSEU NEGATIVE 01/03/2018 2136   HGBUR SMALL (A) 01/03/2018 2136   BILIRUBINUR NEGATIVE 01/03/2018 2136   KETONESUR NEGATIVE 01/03/2018 2136   PROTEINUR NEGATIVE 01/03/2018 2136   NITRITE NEGATIVE 01/03/2018 2136   LEUKOCYTESUR NEGATIVE 01/03/2018 2136   Sepsis Labs: @LABRCNTIP (procalcitonin:4,lacticidven:4) )No results found for this or any previous visit (from the past 240 hour(s)).   Radiological Exams on Admission: Ct Angio Head W Or Wo Contrast  Result Date: 01/03/2018 CLINICAL DATA:  Aphasia EXAM: CT HEAD CODE STROKE WITHOUT CONTRAST CT ANGIOGRAPHY HEAD AND NECK CT PERFUSION BRAIN TECHNIQUE: Multidetector CT imaging of the head and neck was performed using the standard protocol during bolus administration of intravenous contrast. Multiplanar CT image reconstructions and MIPs were obtained to evaluate the vascular anatomy. Carotid stenosis measurements (when applicable) are obtained utilizing NASCET criteria, using the distal internal carotid diameter as the denominator. Multiphase CT imaging of the brain was performed before and following IV bolus contrast injection. Subsequent parametric perfusion maps were calculated using RAPID software. CONTRAST:  176mL ISOVUE-370 IOPAMIDOL (ISOVUE-370) INJECTION 76% COMPARISON:  None. FINDINGS: CT HEAD FINDINGS Brain: There is no mass, hemorrhage or extra-axial collection. There is generalized atrophy without lobar predilection. There are old right gangliothalamic lacunar infarcts. There is hypoattenuation of the periventricular white matter, most commonly indicating chronic ischemic microangiopathy. Skull: The visualized skull base, calvarium and extracranial soft tissues are normal. Sinuses/Orbits: No fluid levels or advanced mucosal thickening of the visualized paranasal sinuses. No mastoid or  middle ear effusion. The orbits are normal. ASPECTS ([place] Stroke Program Early CT Score) - Ganglionic level infarction (caudate, lentiform nuclei, internal capsule, insula, M1-M3 cortex): 7 - Supraganglionic infarction (M4-M6 cortex): 3 Total score (0-10 with 10 being normal): 10 Review of the MIP images confirms the above findings CTA NECK FINDINGS AORTIC ARCH: There is mild calcific atherosclerosis of the aortic arch. There is no aneurysm, dissection or  hemodynamically significant stenosis of the visualized ascending aorta and aortic arch. Conventional 3 vessel aortic branching pattern. The visualized proximal subclavian arteries are widely patent. RIGHT CAROTID SYSTEM: --Common carotid artery: Widely patent origin without common carotid artery dissection or aneurysm. --Internal carotid artery: No dissection, occlusion or aneurysm. No hemodynamically significant stenosis. --External carotid artery: No acute abnormality. LEFT CAROTID SYSTEM: --Common carotid artery: Widely patent origin without common carotid artery dissection or aneurysm. --Internal carotid artery:No dissection, occlusion or aneurysm. No hemodynamically significant stenosis. --External carotid artery: No acute abnormality. VERTEBRAL ARTERIES: Left dominant configuration. Both origins are normal. No dissection, occlusion or flow-limiting stenosis to the vertebrobasilar confluence. SKELETON: There is no bony spinal canal stenosis. No lytic or blastic lesion. OTHER NECK: Normal pharynx, larynx and major salivary glands. No cervical lymphadenopathy. Unremarkable thyroid gland. UPPER CHEST: No pneumothorax or pleural effusion. No nodules or masses. 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 There is atherosclerotic calcification of  both intracranial internal carotid arteries without hemodynamically significant stenosis. --Anterior cerebral arteries: Normal. Absent left A1 segment, normal variant. A-comm is patent. --Middle cerebral arteries: Normal. --Posterior communicating arteries: Absent bilaterally. POSTERIOR CIRCULATION: --Basilar artery: Normal. --Posterior cerebral arteries: Normal. --Superior cerebellar arteries: Normal. --Inferior cerebellar arteries: Normal anterior and posterior inferior cerebellar arteries. VENOUS SINUSES: As permitted by contrast timing, patent. ANATOMIC VARIANTS: None DELAYED PHASE: Not performed. Review of the MIP images confirms the above findings. CT Brain Perfusion Findings: CBF (<30%) Volume: 36mL Perfusion (Tmax>6.0s) volume: 30mL Mismatch Volume: 11mL Infarction Location:None IMPRESSION: 1. No intracranial hemorrhage.  ASPECTS is 10. 2. No emergent large vessel occlusion or hemodynamically significant stenosis of the head or neck. 3. 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 4. No ischemia demonstrated by CT perfusion analysis. These results were called by telephone at the time of interpretation on 01/03/2018 at 10:40 pm to Dr. Kerney Elbe, who verbally acknowledged these results. Electronically Signed   By: Ulyses Jarred M.D.   On: 01/03/2018 22:52   Ct Angio Neck W Or Wo Contrast  Result Date: 01/03/2018 CLINICAL DATA:  Aphasia EXAM: CT HEAD CODE STROKE WITHOUT CONTRAST CT ANGIOGRAPHY HEAD AND NECK CT PERFUSION BRAIN TECHNIQUE: Multidetector CT imaging of the head and neck was performed using the standard protocol during bolus administration of intravenous contrast. Multiplanar CT image reconstructions and MIPs were obtained to evaluate the vascular anatomy. Carotid stenosis measurements (when applicable) are obtained utilizing NASCET criteria, using the distal internal carotid diameter as the denominator. Multiphase  CT imaging of the brain was performed before and following IV bolus contrast injection. Subsequent parametric perfusion maps were calculated using RAPID software. CONTRAST:  163mL ISOVUE-370 IOPAMIDOL (ISOVUE-370) INJECTION 76% COMPARISON:  None. FINDINGS: CT HEAD FINDINGS Brain: There is no mass, hemorrhage or extra-axial collection. There is generalized atrophy without lobar predilection. There are old right gangliothalamic lacunar infarcts. There is hypoattenuation of the periventricular white matter, most commonly indicating chronic ischemic microangiopathy. Skull: The visualized skull base, calvarium and extracranial soft tissues are normal. Sinuses/Orbits: No fluid levels or advanced mucosal thickening of the visualized paranasal sinuses. No mastoid or middle ear effusion. The orbits are normal. ASPECTS [facility] Stroke Program Early CT Score) - Ganglionic level infarction (caudate, lentiform nuclei, internal capsule, insula,  M1-M3 cortex): 7 - Supraganglionic infarction (M4-M6 cortex): 3 Total score (0-10 with 10 being normal): 10 Review of the MIP images confirms the above findings CTA NECK FINDINGS AORTIC ARCH: There is mild calcific atherosclerosis of the aortic arch. There is no aneurysm, dissection or hemodynamically significant stenosis of the visualized ascending aorta and aortic arch. Conventional 3 vessel aortic branching pattern. The visualized proximal subclavian arteries are widely patent. RIGHT CAROTID SYSTEM: --Common carotid artery: Widely patent origin without common carotid artery dissection or aneurysm. --Internal carotid artery: No dissection, occlusion or aneurysm. No hemodynamically significant stenosis. --External carotid artery: No acute abnormality. LEFT CAROTID SYSTEM: --Common carotid artery: Widely patent origin without common carotid artery dissection or aneurysm. --Internal carotid artery:No dissection, occlusion or aneurysm. No hemodynamically significant stenosis. --External  carotid artery: No acute abnormality. VERTEBRAL ARTERIES: Left dominant configuration. Both origins are normal. No dissection, occlusion or flow-limiting stenosis to the vertebrobasilar confluence. SKELETON: There is no bony spinal canal stenosis. No lytic or blastic lesion. OTHER NECK: Normal pharynx, larynx and major salivary glands. No cervical lymphadenopathy. Unremarkable thyroid gland. UPPER CHEST: No pneumothorax or pleural effusion. No nodules or masses. 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 There is atherosclerotic calcification of both intracranial internal carotid arteries without hemodynamically significant stenosis. --Anterior cerebral arteries: Normal. Absent left A1 segment, normal variant. A-comm is patent. --Middle cerebral arteries: Normal. --Posterior communicating arteries: Absent bilaterally. POSTERIOR CIRCULATION: --Basilar artery: Normal. --Posterior cerebral arteries: Normal. --Superior cerebellar arteries: Normal. --Inferior cerebellar arteries: Normal anterior and posterior inferior cerebellar arteries. VENOUS SINUSES: As permitted by contrast timing, patent. ANATOMIC VARIANTS: None DELAYED PHASE: Not performed. Review of the MIP images confirms the above findings. CT Brain Perfusion Findings: CBF (<30%) Volume: 73mL Perfusion (Tmax>6.0s) volume: 7mL Mismatch Volume: 64mL Infarction Location:None IMPRESSION: 1. No intracranial hemorrhage.  ASPECTS is 10. 2. No emergent large vessel occlusion or hemodynamically significant stenosis of the head or neck. 3. 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 4. No ischemia demonstrated by CT perfusion analysis. These results were called by  telephone at the time of interpretation on 01/03/2018 at 10:40 pm to Dr. Kerney Elbe, who verbally acknowledged these results. Electronically Signed   By: Ulyses Jarred M.D.   On: 01/03/2018 22:52   Ct Cerebral Perfusion W Contrast  Result Date: 01/03/2018 CLINICAL DATA:  Aphasia EXAM: CT HEAD CODE STROKE WITHOUT CONTRAST CT ANGIOGRAPHY HEAD AND NECK CT PERFUSION BRAIN TECHNIQUE: Multidetector CT imaging of the head and neck was performed using the standard protocol during bolus administration of intravenous contrast. Multiplanar CT image reconstructions and MIPs were obtained to evaluate the vascular anatomy. Carotid stenosis measurements (when applicable) are obtained utilizing NASCET criteria, using the distal internal carotid diameter as the denominator. Multiphase CT imaging of the brain was performed before and following IV bolus contrast injection. Subsequent parametric perfusion maps were calculated using RAPID software. CONTRAST:  18mL ISOVUE-370 IOPAMIDOL (ISOVUE-370) INJECTION 76% COMPARISON:  None. FINDINGS: CT HEAD FINDINGS Brain: There is no mass, hemorrhage or extra-axial collection. There is generalized atrophy without lobar predilection. There are old right gangliothalamic lacunar infarcts. There is hypoattenuation of the periventricular white matter, most commonly indicating chronic ischemic microangiopathy. Skull: The visualized skull base, calvarium and extracranial  soft tissues are normal. Sinuses/Orbits: No fluid levels or advanced mucosal thickening of the visualized paranasal sinuses. No mastoid or middle ear effusion. The orbits are normal. ASPECTS ([place] Stroke Program Early CT Score) - Ganglionic level infarction (caudate, lentiform nuclei, internal capsule, insula, M1-M3 cortex): 7 - Supraganglionic infarction (M4-M6 cortex): 3 Total score (0-10 with 10 being normal): 10 Review of the MIP images confirms the above findings CTA NECK FINDINGS AORTIC ARCH: There is mild calcific  atherosclerosis of the aortic arch. There is no aneurysm, dissection or hemodynamically significant stenosis of the visualized ascending aorta and aortic arch. Conventional 3 vessel aortic branching pattern. The visualized proximal subclavian arteries are widely patent. RIGHT CAROTID SYSTEM: --Common carotid artery: Widely patent origin without common carotid artery dissection or aneurysm. --Internal carotid artery: No dissection, occlusion or aneurysm. No hemodynamically significant stenosis. --External carotid artery: No acute abnormality. LEFT CAROTID SYSTEM: --Common carotid artery: Widely patent origin without common carotid artery dissection or aneurysm. --Internal carotid artery:No dissection, occlusion or aneurysm. No hemodynamically significant stenosis. --External carotid artery: No acute abnormality. VERTEBRAL ARTERIES: Left dominant configuration. Both origins are normal. No dissection, occlusion or flow-limiting stenosis to the vertebrobasilar confluence. SKELETON: There is no bony spinal canal stenosis. No lytic or blastic lesion. OTHER NECK: Normal pharynx, larynx and major salivary glands. No cervical lymphadenopathy. Unremarkable thyroid gland. UPPER CHEST: No pneumothorax or pleural effusion. No nodules or masses. 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 There is atherosclerotic calcification of both intracranial internal carotid arteries without hemodynamically significant stenosis. --Anterior cerebral arteries: Normal. Absent left A1 segment, normal variant. A-comm is patent. --Middle cerebral arteries: Normal. --Posterior communicating arteries: Absent bilaterally. POSTERIOR CIRCULATION: --Basilar artery: Normal. --Posterior cerebral arteries: Normal. --Superior cerebellar  arteries: Normal. --Inferior cerebellar arteries: Normal anterior and posterior inferior cerebellar arteries. VENOUS SINUSES: As permitted by contrast timing, patent. ANATOMIC VARIANTS: None DELAYED PHASE: Not performed. Review of the MIP images confirms the above findings. CT Brain Perfusion Findings: CBF (<30%) Volume: 80mL Perfusion (Tmax>6.0s) volume: 32mL Mismatch Volume: 48mL Infarction Location:None IMPRESSION: 1. No intracranial hemorrhage.  ASPECTS is 10. 2. No emergent large vessel occlusion or hemodynamically significant stenosis of the head or neck. 3. 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 4. No ischemia demonstrated by CT perfusion analysis. These results were called by telephone at the time of interpretation on 01/03/2018 at 10:40 pm to Dr. Kerney Elbe, who verbally acknowledged these results. Electronically Signed   By: Ulyses Jarred M.D.   On: 01/03/2018 22:52   Ct Head Code Stroke Wo Contrast`  Result Date: 01/03/2018 CLINICAL DATA:  Aphasia EXAM: CT HEAD CODE STROKE WITHOUT CONTRAST CT ANGIOGRAPHY HEAD AND NECK CT PERFUSION BRAIN TECHNIQUE: Multidetector CT imaging of the head and neck was performed using the standard protocol during bolus administration of intravenous contrast. Multiplanar CT image reconstructions and MIPs were obtained to evaluate the vascular anatomy. Carotid stenosis measurements (when applicable) are obtained utilizing NASCET criteria, using the distal internal carotid diameter as the denominator. Multiphase CT imaging of the brain was performed before and following IV bolus contrast injection. Subsequent parametric perfusion maps were calculated using RAPID software. CONTRAST:  142mL ISOVUE-370 IOPAMIDOL (ISOVUE-370) INJECTION 76% COMPARISON:  None. FINDINGS: CT HEAD FINDINGS  Brain: There is no mass, hemorrhage or extra-axial collection. There is generalized atrophy without lobar  predilection. There are old right gangliothalamic lacunar infarcts. There is hypoattenuation of the periventricular white matter, most commonly indicating chronic ischemic microangiopathy. Skull: The visualized skull base, calvarium and extracranial soft tissues are normal. Sinuses/Orbits: No fluid levels or advanced mucosal thickening of the visualized paranasal sinuses. No mastoid or middle ear effusion. The orbits are normal. ASPECTS ([place] Stroke Program Early CT Score) - Ganglionic level infarction (caudate, lentiform nuclei, internal capsule, insula, M1-M3 cortex): 7 - Supraganglionic infarction (M4-M6 cortex): 3 Total score (0-10 with 10 being normal): 10 Review of the MIP images confirms the above findings CTA NECK FINDINGS AORTIC ARCH: There is mild calcific atherosclerosis of the aortic arch. There is no aneurysm, dissection or hemodynamically significant stenosis of the visualized ascending aorta and aortic arch. Conventional 3 vessel aortic branching pattern. The visualized proximal subclavian arteries are widely patent. RIGHT CAROTID SYSTEM: --Common carotid artery: Widely patent origin without common carotid artery dissection or aneurysm. --Internal carotid artery: No dissection, occlusion or aneurysm. No hemodynamically significant stenosis. --External carotid artery: No acute abnormality. LEFT CAROTID SYSTEM: --Common carotid artery: Widely patent origin without common carotid artery dissection or aneurysm. --Internal carotid artery:No dissection, occlusion or aneurysm. No hemodynamically significant stenosis. --External carotid artery: No acute abnormality. VERTEBRAL ARTERIES: Left dominant configuration. Both origins are normal. No dissection, occlusion or flow-limiting stenosis to the vertebrobasilar confluence. SKELETON: There is no bony spinal canal stenosis. No lytic or blastic lesion. OTHER NECK: Normal pharynx, larynx and major salivary glands. No cervical lymphadenopathy. Unremarkable  thyroid gland. UPPER CHEST: No pneumothorax or pleural effusion. No nodules or masses. 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 There is atherosclerotic calcification of both intracranial internal carotid arteries without hemodynamically significant stenosis. --Anterior cerebral arteries: Normal. Absent left A1 segment, normal variant. A-comm is patent. --Middle cerebral arteries: Normal. --Posterior communicating arteries: Absent bilaterally. POSTERIOR CIRCULATION: --Basilar artery: Normal. --Posterior cerebral arteries: Normal. --Superior cerebellar arteries: Normal. --Inferior cerebellar arteries: Normal anterior and posterior inferior cerebellar arteries. VENOUS SINUSES: As permitted by contrast timing, patent. ANATOMIC VARIANTS: None DELAYED PHASE: Not performed. Review of the MIP images confirms the above findings. CT Brain Perfusion Findings: CBF (<30%) Volume: 28mL Perfusion (Tmax>6.0s) volume: 23mL Mismatch Volume: 42mL Infarction Location:None IMPRESSION: 1. No intracranial hemorrhage.  ASPECTS is 10. 2. No emergent large vessel occlusion or hemodynamically significant stenosis of the head or neck. 3. 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 4. No ischemia demonstrated by CT perfusion analysis. These results were called by telephone at the time of interpretation on 01/03/2018 at 10:40 pm to Dr. Kerney Elbe, who verbally acknowledged these results. Electronically Signed   By: Ulyses Jarred M.D.   On: 01/03/2018 22:52    EKG: Independently reviewed.  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  Assessment/Plan Principal Problem:   TIA (transient ischemic attack) Active Problems:   Bronchiectasis without  acute exacerbation (HCC)   COPD with emphysema (HCC)   Peripheral neuralgia   Permanent atrial fibrillation ([place])    1. TIA -appreciate neurology consult.  Discussed with Dr. Cheral Marker on-call neurologist.  At this time neurologist has advised to continue with Pradaxa and patient is  also on Lipitor.  Will get MRI brain 2D echo.  Patient has passed swallow.  We will get physical therapy consult keep patient on neurochecks check hemoglobin A1c and lipid panel. 2. Lower extremity pain with history of peripheral neuropathy on gabapentin.  Pain is increased from normal.  On exam patient does not have any ischemic changes in the lower extremity and has good pulses.  Patient on Neurontin will closely observe. 3. Leukocytosis -patient is afebrile.  Chest x-ray is pending.  UA is unremarkable. 4. History of bronchiectasis on inhalers.  Patient states inhalers are helping. 5. Atrial fibrillation on Pradaxa and Cardizem. 6. Internal carotid artery aneurysm -further recommendations per neurologist.   DVT prophylaxis: Pradaxa. Code Status: Full code. Family Communication: Patient's grandson. Disposition Plan: Home. Consults called: Neurologist. Admission status: Observation.   Rise Patience MD [facility] Pager [phone].  If 7PM-7AM, please contact night-coverage [email or web address] Password [email or web address]  01/04/2018, 1:07 AM

## 2018-01-04 NOTE — Care Management Note (Addendum)
Case Management Note  Patient Details  Name: Damonique Brunelle MRN: [id number] Date of Birth: 1928/09/10  Subjective/Objective:                    Action/Plan:  Discussed Medicare observation letter with patient and grandson Roderic Palau at bedside. Roderic Palau also had conference call with his mother Louellen Molder ( patient's daughter ) and his wife Caryl Pina. NCM explained observation letter to all four, they voiced understanding but declined to sign.   Plan : patient will discharge to address on face sheet. Jonathan's sister in law is patient's CNA three times a week. They will arrange for CNA to stay all day and patient's friend Jearld Fenton to spend nights. They would like home health services through [facility]. Eventually Roderic Palau is moving his grandmother to his home in [place].   Called Jeneen Rinks with [facility] to deliver rolling walker  Expected Discharge Date:                  Expected Discharge Plan:  [place]  In-House Referral:     Discharge planning Services  CM Consult  Post Acute Care Choice:    Choice offered to:  Patient, Adult Children  DME Arranged:  Walker rolling DME Agency:  [place] Arranged:  RN, Disease Management, PT, OT [place] Agency:  [facility]  Status of Service:  In process, will continue to follow  If discussed at Long Length of Stay Meetings, dates discussed:    Additional Comments:  Marilu Favre, RN 01/04/2018, 2:49 PM

## 2018-01-04 NOTE — Progress Notes (Signed)
PROGRESS NOTE    Jocelyn Ramirez  MWN:[id number] DOB: [birth year] DOA: 01/03/2018 PCP: Leeroy Cha, MD   Brief Narrative:   Jocelyn Ramirez is a 84 y.o. female with history of atrial fibrillation, lung cancer status post lobectomy in the 90s, bronchiectasis, peripheral neuropathy was brought to the ER after patient was found to be confused by the family.  As per patient's grandson who provided the history patient's neighbors noticed that patient was getting confused and not able to bring out words since day prior to admission at 40 PM.  Patient's grandson was actually coming to meet the patient from [place].  On seeing the patient patient was found to be confused and had some difficulty bringing her worse and was brought to the ER.  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  Patient also states that she had a stroke last month but was not able to provide further history on that.  Denies any weakness of the upper or lower extremities or any difficulty swallowing. 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  Chest x-ray shows no acute cardiopulmonary abnormality.  Echocardiogram is pending.  Assessment & Plan:   Principal Problem:   TIA (transient ischemic attack) Active Problems:   Bronchiectasis without acute exacerbation (HCC)   COPD with emphysema (HCC)   Peripheral neuralgia   Permanent atrial fibrillation ([place])   1. TIA -appreciate neurology consult.  At this time neurologist has advised to continue with Pradaxa and patient is also on Lipitor.    Echocardiogram is pending Patient has passed swallow.  We will get physical therapy consult keep patient on neurochecks.  check hemoglobin A1c (pending) and lipid panel (unremarkable). 2. lower extremity pain with history of peripheral neuropathy on gabapentin.  Pain is increased from normal.  On exam patient does not have any ischemic  changes in the lower extremity and has good pulses.  Patient on Neurontin will closely observe. 3. Leukocytosis -patient is afebrile.  Chest x-ray is pending.  UA is unremarkable. 4. History of bronchiectasis on inhalers.  Patient states inhalers are helping. 5. Atrial fibrillation on Pradaxa and Cardizem. 6. Internal carotid artery aneurysm -further recommendations per neurologist.   DVT prophylaxis: Pradaxa. Code Status: Full code. Family Communication: Patient's grandson. Disposition Plan: Home. Consults called: Neurologist. Admission status: Observation.   Procedures:   MRI shows no acute stroke but multiple old cerebellar, thalamic, and lacunar infarctions.   Subjective: Patient with no new complaints.  Work-up Is in progress.  Objective: Vitals:   01/04/18 0521 01/04/18 0700 01/04/18 0800 01/04/18 0915  BP: (!) 146/76 (!) 109/98 102/66 128/76  Pulse: 86 81 96 86  Resp: (!) 26 (!) 21 20 (!) 24  Temp:    97.8 F (36.6 C)  TempSrc:    Oral  SpO2: 96% 97% 93% 96%  Weight:      Height:       No intake or output data in the 24 hours ending 01/04/18 1504 Filed Weights   01/03/18 2042  Weight: 64 kg    Examination:  General exam: Appears calm and comfortable  Respiratory system: Clear to auscultation. Respiratory effort normal. Cardiovascular system: S1 & S2 heard, RRR. No JVD, murmurs, rubs, gallops or clicks. No pedal edema. Gastrointestinal system: Abdomen is nondistended, soft and nontender. No organomegaly or masses felt. Normal bowel sounds heard. Central nervous system: Alert and oriented. No focal neurological deficits. Extremities: Symmetric 5 x 5 power.  Skin: No rashes, lesions or ulcers Psychiatry: Judgement and insight appear normal. Mood & affect appropriate.     Data Reviewed: I have personally reviewed following labs and imaging studies  CBC: Recent Labs  Lab 01/03/18 2039 01/03/18 2116 01/03/18 2129 01/04/18 0525  WBC 17.6*  --   --   14.3*  NEUTROABS  --  14.4*  --   --   HGB 12.6  --  13.3 12.7  HCT 39.9  --  39.0 40.2  MCV 90.7  --   --  89.3  PLT 223  --   --  373   Basic Metabolic Panel: Recent Labs  Lab 01/03/18 2039 01/03/18 2129 01/04/18 0525  NA 139 140 139  K 4.2 3.9 3.8  CL 106 106 110  CO2 22  --  19*  GLUCOSE 168* 161* 120*  BUN 16 18 11   CREATININE 0.78 0.60 0.63  CALCIUM 9.6  --  9.2   GFR: Estimated Creatinine Clearance: 44.6 mL/min (by C-G formula based on SCr of 0.63 mg/dL). Liver Function Tests: Recent Labs  Lab 01/03/18 2039 01/04/18 0525  AST 22 17  ALT 12 12  ALKPHOS 54 58  BILITOT 1.1 1.2  PROT 6.4* 6.5  ALBUMIN 3.6 3.4*   No results for input(s): LIPASE, AMYLASE in the last 168 hours. No results for input(s): AMMONIA in the last 168 hours. Coagulation Profile: Recent Labs  Lab 01/03/18 2116  INR 1.24   Cardiac Enzymes: No results for input(s): CKTOTAL, CKMB, CKMBINDEX, TROPONINI in the last 168 hours. BNP (last 3 results) No results for input(s): PROBNP in the last 8760 hours. HbA1C: No results for input(s): HGBA1C in the last 72 hours. CBG: Recent Labs  Lab 01/03/18 2044  GLUCAP 155*   Lipid Profile: Recent Labs    01/04/18 0525  CHOL 136  HDL 58  LDLCALC 69  TRIG 74  CHOLHDL 2.3     Radiology Studies: Ct Angio Head W Or Wo Contrast  Result Date: 01/03/2018 CLINICAL DATA:  Aphasia EXAM: CT HEAD CODE STROKE WITHOUT CONTRAST CT ANGIOGRAPHY HEAD AND NECK CT PERFUSION BRAIN TECHNIQUE: Multidetector CT imaging of the head and neck was performed using the standard protocol during bolus administration of intravenous contrast. Multiplanar CT image reconstructions and MIPs were obtained to evaluate the vascular anatomy. Carotid stenosis measurements (when applicable) are obtained utilizing NASCET criteria, using the distal internal carotid diameter as the denominator. Multiphase CT imaging of the brain was performed before and following IV bolus contrast  injection. Subsequent parametric perfusion maps were calculated using RAPID software. CONTRAST:  132mL ISOVUE-370 IOPAMIDOL (ISOVUE-370) INJECTION 76% COMPARISON:  None. FINDINGS: CT HEAD FINDINGS Brain: There is no mass, hemorrhage or extra-axial collection. There is generalized atrophy without lobar predilection. There are old right gangliothalamic lacunar infarcts. There is hypoattenuation of the periventricular white matter, most commonly indicating chronic ischemic microangiopathy. Skull: The visualized skull base, calvarium and extracranial soft tissues are normal. Sinuses/Orbits: No fluid levels or advanced mucosal thickening of the visualized paranasal sinuses. No mastoid or middle ear effusion. The orbits are normal. ASPECTS ([place] Stroke Program Early CT Score) - Ganglionic level infarction (caudate, lentiform nuclei, internal capsule, insula, M1-M3 cortex): 7 - Supraganglionic infarction (M4-M6 cortex): 3 Total score (0-10 with 10 being normal): 10 Review of the MIP images confirms the above findings CTA NECK FINDINGS AORTIC ARCH: There is mild calcific atherosclerosis of the aortic arch. There is no aneurysm, dissection or hemodynamically significant stenosis of the visualized ascending aorta and  aortic arch. Conventional 3 vessel aortic branching pattern. The visualized proximal subclavian arteries are widely patent. RIGHT CAROTID SYSTEM: --Common carotid artery: Widely patent origin without common carotid artery dissection or aneurysm. --Internal carotid artery: No dissection, occlusion or aneurysm. No hemodynamically significant stenosis. --External carotid artery: No acute abnormality. LEFT CAROTID SYSTEM: --Common carotid artery: Widely patent origin without common carotid artery dissection or aneurysm. --Internal carotid artery:No dissection, occlusion or aneurysm. No hemodynamically significant stenosis. --External carotid artery: No acute abnormality. VERTEBRAL ARTERIES: Left dominant  configuration. Both origins are normal. No dissection, occlusion or flow-limiting stenosis to the vertebrobasilar confluence. SKELETON: There is no bony spinal canal stenosis. No lytic or blastic lesion. OTHER NECK: Normal pharynx, larynx and major salivary glands. No cervical lymphadenopathy. Unremarkable thyroid gland. UPPER CHEST: No pneumothorax or pleural effusion. No nodules or masses. 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 There is atherosclerotic calcification of both intracranial internal carotid arteries without hemodynamically significant stenosis. --Anterior cerebral arteries: Normal. Absent left A1 segment, normal variant. A-comm is patent. --Middle cerebral arteries: Normal. --Posterior communicating arteries: Absent bilaterally. POSTERIOR CIRCULATION: --Basilar artery: Normal. --Posterior cerebral arteries: Normal. --Superior cerebellar arteries: Normal. --Inferior cerebellar arteries: Normal anterior and posterior inferior cerebellar arteries. VENOUS SINUSES: As permitted by contrast timing, patent. ANATOMIC VARIANTS: None DELAYED PHASE: Not performed. Review of the MIP images confirms the above findings. CT Brain Perfusion Findings: CBF (<30%) Volume: 29mL Perfusion (Tmax>6.0s) volume: 40mL Mismatch Volume: 39mL Infarction Location:None IMPRESSION: 1. No intracranial hemorrhage.  ASPECTS is 10. 2. No emergent large vessel occlusion or hemodynamically significant stenosis of the head or neck. 3. 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 4. No ischemia demonstrated by CT perfusion analysis. These results were called by telephone at the time of interpretation on 01/03/2018 at 10:40 pm to Dr. Kerney Elbe, who verbally acknowledged these results. Electronically Signed   By: Ulyses Jarred M.D.   On: 01/03/2018 22:52   Ct Angio Neck W Or Wo Contrast  Result Date: 01/03/2018 CLINICAL DATA:  Aphasia EXAM: CT HEAD CODE STROKE WITHOUT CONTRAST CT ANGIOGRAPHY HEAD AND NECK CT PERFUSION BRAIN TECHNIQUE: Multidetector CT imaging of the head and neck was performed using the standard protocol during bolus administration of intravenous contrast. Multiplanar CT image reconstructions and MIPs were obtained to evaluate the vascular anatomy. Carotid stenosis measurements (when applicable) are obtained utilizing NASCET criteria, using the distal internal carotid diameter as the denominator. Multiphase CT imaging of the brain was performed before and following IV bolus contrast injection. Subsequent parametric perfusion maps were calculated using RAPID software. CONTRAST:  183mL ISOVUE-370 IOPAMIDOL (ISOVUE-370) INJECTION 76% COMPARISON:  None. FINDINGS: CT HEAD FINDINGS Brain: There is no mass, hemorrhage or extra-axial collection. There is generalized atrophy without lobar predilection. There are old right gangliothalamic lacunar infarcts. There is hypoattenuation of the periventricular white matter, most commonly indicating chronic ischemic microangiopathy. Skull: The visualized skull base, calvarium and extracranial soft tissues are normal. Sinuses/Orbits: No fluid levels or advanced mucosal thickening of the visualized paranasal sinuses. No mastoid or middle ear effusion. The orbits are normal. ASPECTS [facility] Stroke Program Early CT Score) - Ganglionic level infarction (caudate, lentiform nuclei, internal capsule, insula, M1-M3 cortex): 7 - Supraganglionic infarction (M4-M6 cortex): 3  Total score (0-10 with 10 being normal): 10 Review of the MIP images confirms the above findings CTA NECK FINDINGS AORTIC ARCH: There is mild calcific atherosclerosis of the aortic arch. There is no aneurysm, dissection or  hemodynamically significant stenosis of the visualized ascending aorta and aortic arch. Conventional 3 vessel aortic branching pattern. The visualized proximal subclavian arteries are widely patent. RIGHT CAROTID SYSTEM: --Common carotid artery: Widely patent origin without common carotid artery dissection or aneurysm. --Internal carotid artery: No dissection, occlusion or aneurysm. No hemodynamically significant stenosis. --External carotid artery: No acute abnormality. LEFT CAROTID SYSTEM: --Common carotid artery: Widely patent origin without common carotid artery dissection or aneurysm. --Internal carotid artery:No dissection, occlusion or aneurysm. No hemodynamically significant stenosis. --External carotid artery: No acute abnormality. VERTEBRAL ARTERIES: Left dominant configuration. Both origins are normal. No dissection, occlusion or flow-limiting stenosis to the vertebrobasilar confluence. SKELETON: There is no bony spinal canal stenosis. No lytic or blastic lesion. OTHER NECK: Normal pharynx, larynx and major salivary glands. No cervical lymphadenopathy. Unremarkable thyroid gland. UPPER CHEST: No pneumothorax or pleural effusion. No nodules or masses. 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 There is atherosclerotic calcification of both intracranial internal carotid arteries without hemodynamically significant stenosis. --Anterior cerebral arteries: Normal. Absent left A1 segment, normal variant. A-comm is patent. --Middle cerebral arteries: Normal. --Posterior communicating arteries: Absent bilaterally. POSTERIOR CIRCULATION: --Basilar artery: Normal. --Posterior cerebral arteries: Normal. --Superior cerebellar arteries: Normal. --Inferior cerebellar arteries: Normal anterior and  posterior inferior cerebellar arteries. VENOUS SINUSES: As permitted by contrast timing, patent. ANATOMIC VARIANTS: None DELAYED PHASE: Not performed. Review of the MIP images confirms the above findings. CT Brain Perfusion Findings: CBF (<30%) Volume: 23mL Perfusion (Tmax>6.0s) volume: 37mL Mismatch Volume: 65mL Infarction Location:None IMPRESSION: 1. No intracranial hemorrhage.  ASPECTS is 10. 2. No emergent large vessel occlusion or hemodynamically significant stenosis of the head or neck. 3. 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 4. No ischemia demonstrated by CT perfusion analysis. These results were called by telephone at the time of interpretation on 01/03/2018 at 10:40 pm to Dr. Kerney Elbe, who verbally acknowledged these results. Electronically Signed   By: Ulyses Jarred M.D.   On: 01/03/2018 22:52   Mr Brain Wo Contrast  Result Date: 01/04/2018 CLINICAL DATA:  Aphasia EXAM: MRI HEAD WITHOUT CONTRAST TECHNIQUE: Multiplanar, multiecho pulse sequences of the brain and surrounding structures were obtained without intravenous contrast. COMPARISON:  Head CT/CTA/CTP 01/03/2018 FINDINGS: BRAIN: There is no acute infarct, acute hemorrhage or mass effect. The midline structures are normal. There are multiple old cerebellar infarcts, more so on the right. There are old bilateral ganglia thalamic infarcts. Early confluent hyperintense T2-weighted signal of the periventricular and deep white matter, most commonly due to chronic ischemic microangiopathy. Generalized atrophy without lobar predilection. Susceptibility-sensitive sequences show no chronic microhemorrhage or superficial siderosis. VASCULAR: Major intracranial arterial and venous sinus flow voids are preserved. SKULL AND UPPER CERVICAL SPINE: The visualized skull base, calvarium, upper cervical spine and extracranial soft tissues are normal. SINUSES/ORBITS: No fluid  levels or advanced mucosal thickening. No mastoid or middle ear effusion. The orbits are normal. IMPRESSION: 1. No acute intracranial abnormality. 2. Multiple old cerebellar, basal ganglia and thalamus lacunar infarcts. 3. Atrophy and  chronic ischemic microangiopathy. Electronically Signed   By: Ulyses Jarred M.D.   On: 01/04/2018 05:29   Ct Cerebral Perfusion W Contrast  Result Date: 01/03/2018 CLINICAL DATA:  Aphasia EXAM: CT HEAD CODE STROKE WITHOUT CONTRAST CT ANGIOGRAPHY HEAD AND NECK CT PERFUSION BRAIN TECHNIQUE: Multidetector CT imaging of the head and neck was performed using the standard protocol during bolus administration of intravenous contrast. Multiplanar CT image reconstructions and MIPs were obtained to evaluate the vascular anatomy. Carotid stenosis measurements (when applicable) are obtained utilizing NASCET criteria, using the distal internal carotid diameter as the denominator. Multiphase CT imaging of the brain was performed before and following IV bolus contrast injection. Subsequent parametric perfusion maps were calculated using RAPID software. CONTRAST:  124mL ISOVUE-370 IOPAMIDOL (ISOVUE-370) INJECTION 76% COMPARISON:  None. FINDINGS: CT HEAD FINDINGS Brain: There is no mass, hemorrhage or extra-axial collection. There is generalized atrophy without lobar predilection. There are old right gangliothalamic lacunar infarcts. There is hypoattenuation of the periventricular white matter, most commonly indicating chronic ischemic microangiopathy. Skull: The visualized skull base, calvarium and extracranial soft tissues are normal. Sinuses/Orbits: No fluid levels or advanced mucosal thickening of the visualized paranasal sinuses. No mastoid or middle ear effusion. The orbits are normal. ASPECTS ([place] Stroke Program Early CT Score) - Ganglionic level infarction (caudate, lentiform nuclei, internal capsule, insula, M1-M3 cortex): 7 - Supraganglionic infarction (M4-M6 cortex): 3 Total score  (0-10 with 10 being normal): 10 Review of the MIP images confirms the above findings CTA NECK FINDINGS AORTIC ARCH: There is mild calcific atherosclerosis of the aortic arch. There is no aneurysm, dissection or hemodynamically significant stenosis of the visualized ascending aorta and aortic arch. Conventional 3 vessel aortic branching pattern. The visualized proximal subclavian arteries are widely patent. RIGHT CAROTID SYSTEM: --Common carotid artery: Widely patent origin without common carotid artery dissection or aneurysm. --Internal carotid artery: No dissection, occlusion or aneurysm. No hemodynamically significant stenosis. --External carotid artery: No acute abnormality. LEFT CAROTID SYSTEM: --Common carotid artery: Widely patent origin without common carotid artery dissection or aneurysm. --Internal carotid artery:No dissection, occlusion or aneurysm. No hemodynamically significant stenosis. --External carotid artery: No acute abnormality. VERTEBRAL ARTERIES: Left dominant configuration. Both origins are normal. No dissection, occlusion or flow-limiting stenosis to the vertebrobasilar confluence. SKELETON: There is no bony spinal canal stenosis. No lytic or blastic lesion. OTHER NECK: Normal pharynx, larynx and major salivary glands. No cervical lymphadenopathy. Unremarkable thyroid gland. UPPER CHEST: No pneumothorax or pleural effusion. No nodules or masses. 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 There is atherosclerotic calcification of both intracranial internal carotid arteries without hemodynamically significant stenosis. --Anterior cerebral arteries: Normal. Absent left A1 segment, normal variant. A-comm is patent. --Middle cerebral arteries: Normal. --Posterior communicating  arteries: Absent bilaterally. POSTERIOR CIRCULATION: --Basilar artery: Normal. --Posterior cerebral arteries: Normal. --Superior cerebellar arteries: Normal. --Inferior cerebellar arteries: Normal anterior and posterior inferior cerebellar arteries. VENOUS SINUSES: As permitted by contrast timing, patent. ANATOMIC VARIANTS: None DELAYED PHASE: Not performed. Review of the MIP images confirms the above findings. CT Brain Perfusion Findings: CBF (<30%) Volume: 21mL Perfusion (Tmax>6.0s) volume: 77mL Mismatch Volume: 33mL Infarction Location:None IMPRESSION: 1. No intracranial hemorrhage.  ASPECTS is 10. 2. No emergent large vessel occlusion or hemodynamically significant stenosis of the head or neck. 3. Posteriorly projecting right internal carotid artery clinoid  segment aneurysm measuring 5 mm at its base and 3.5 mm base to apex. 4. No ischemia demonstrated by CT perfusion analysis. These results were called by telephone at the time of interpretation on 01/03/2018 at 10:40 pm to Dr. Kerney Elbe, who verbally acknowledged these results. Electronically Signed   By: Ulyses Jarred M.D.   On: 01/03/2018 22:52   Dg Chest Port 1 View  Result Date: 01/04/2018 CLINICAL DATA:  Shortness of breath.  Confusion. EXAM: PORTABLE CHEST 1 VIEW COMPARISON:  Radiograph 12/03/2017, 02/09/2015. FINDINGS: Unchanged heart size and mediastinal contours. Surgical clips at the right hilum with postsurgical volume loss in the right lung. Again seen emphysema. No superimposed pulmonary edema, focal airspace disease, pleural effusion or pneumothorax. No acute osseous abnormalities are seen. IMPRESSION: 1. No acute findings. 2. Postsurgical change and emphysema, stable in appearance from prior exam. Electronically Signed   By: Keith Rake M.D.   On: 01/04/2018 06:58   Ct Head Code Stroke Wo Contrast`  Result Date: 01/03/2018 CLINICAL DATA:  Aphasia EXAM: CT HEAD CODE STROKE WITHOUT CONTRAST CT ANGIOGRAPHY HEAD AND NECK CT PERFUSION BRAIN  TECHNIQUE: Multidetector CT imaging of the head and neck was performed using the standard protocol during bolus administration of intravenous contrast. Multiplanar CT image reconstructions and MIPs were obtained to evaluate the vascular anatomy. Carotid stenosis measurements (when applicable) are obtained utilizing NASCET criteria, using the distal internal carotid diameter as the denominator. Multiphase CT imaging of the brain was performed before and following IV bolus contrast injection. Subsequent parametric perfusion maps were calculated using RAPID software. CONTRAST:  135mL ISOVUE-370 IOPAMIDOL (ISOVUE-370) INJECTION 76% COMPARISON:  None. FINDINGS: CT HEAD FINDINGS Brain: There is no mass, hemorrhage or extra-axial collection. There is generalized atrophy without lobar predilection. There are old right gangliothalamic lacunar infarcts. There is hypoattenuation of the periventricular white matter, most commonly indicating chronic ischemic microangiopathy. Skull: The visualized skull base, calvarium and extracranial soft tissues are normal. Sinuses/Orbits: No fluid levels or advanced mucosal thickening of the visualized paranasal sinuses. No mastoid or middle ear effusion. The orbits are normal. ASPECTS ([place] Stroke Program Early CT Score) - Ganglionic level infarction (caudate, lentiform nuclei, internal capsule, insula, M1-M3 cortex): 7 - Supraganglionic infarction (M4-M6 cortex): 3 Total score (0-10 with 10 being normal): 10 Review of the MIP images confirms the above findings CTA NECK FINDINGS AORTIC ARCH: There is mild calcific atherosclerosis of the aortic arch. There is no aneurysm, dissection or hemodynamically significant stenosis of the visualized ascending aorta and aortic arch. Conventional 3 vessel aortic branching pattern. The visualized proximal subclavian arteries are widely patent. RIGHT CAROTID SYSTEM: --Common carotid artery: Widely patent origin without common carotid artery dissection  or aneurysm. --Internal carotid artery: No dissection, occlusion or aneurysm. No hemodynamically significant stenosis. --External carotid artery: No acute abnormality. LEFT CAROTID SYSTEM: --Common carotid artery: Widely patent origin without common carotid artery dissection or aneurysm. --Internal carotid artery:No dissection, occlusion or aneurysm. No hemodynamically significant stenosis. --External carotid artery: No acute abnormality. VERTEBRAL ARTERIES: Left dominant configuration. Both origins are normal. No dissection, occlusion or flow-limiting stenosis to the vertebrobasilar confluence. SKELETON: There is no bony spinal canal stenosis. No lytic or blastic lesion. OTHER NECK: Normal pharynx, larynx and major salivary glands. No cervical lymphadenopathy. Unremarkable thyroid gland. UPPER CHEST: No pneumothorax or pleural effusion. No nodules or masses. 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 There is atherosclerotic calcification of both intracranial internal carotid arteries without hemodynamically significant stenosis. --Anterior cerebral arteries: Normal. Absent left A1 segment, normal variant. A-comm is patent. --Middle cerebral arteries: Normal. --Posterior communicating arteries: Absent bilaterally. POSTERIOR CIRCULATION: --Basilar artery: Normal. --Posterior cerebral arteries: Normal. --Superior cerebellar arteries: Normal. --Inferior cerebellar arteries: Normal anterior and posterior inferior cerebellar arteries. VENOUS SINUSES: As permitted by contrast timing, patent. ANATOMIC VARIANTS: None DELAYED PHASE: Not performed. Review of the MIP images confirms the above findings. CT Brain Perfusion Findings: CBF (<30%) Volume: 64mL Perfusion (Tmax>6.0s) volume: 19mL Mismatch Volume: 71mL  Infarction Location:None IMPRESSION: 1. No intracranial hemorrhage.  ASPECTS is 10. 2. No emergent large vessel occlusion or hemodynamically significant stenosis of the head or neck. 3. 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 4. No ischemia demonstrated by CT perfusion analysis. These results were called by telephone at the time of interpretation on 01/03/2018 at 10:40 pm to Dr. Kerney Elbe, who verbally acknowledged these results. Electronically Signed   By: Ulyses Jarred M.D.   On: 01/03/2018 22:52        Scheduled Meds: .  stroke: mapping our early stages of recovery book   Does not apply Once  . atorvastatin  10 mg Oral Daily  . dabigatran  150 mg Oral BID  . diltiazem  120 mg Oral BID  . gabapentin  300 mg Oral 5 X Daily  . latanoprost  1 drop Both Eyes QHS  . mometasone-formoterol  2 puff Inhalation BID  . timolol  1 drop Both Eyes BID   Continuous Infusions: . sodium chloride 50 mL/hr at 01/04/18 0308     LOS: 0 days    Time spent: 45 minutes    Lady Deutscher, MD, FACP [facility] Pager [phone]  If 7PM-7AM, please contact night-coverage [email or web address] Password [email or web address] 01/04/2018, 3:04 PM

## 2018-01-04 NOTE — ED Notes (Signed)
Pt eating breakfast and tolerating well.

## 2018-01-04 NOTE — Care Management Obs Status (Signed)
[place] NOTIFICATION   Patient Details  Name: Paticia Moster MRN: [id number] Date of Birth: [birth year]   Medicare Observation Status Notification Given:       Marilu Favre, RN 01/04/2018, 3:01 PM

## 2018-01-04 NOTE — Plan of Care (Signed)

## 2018-01-05 DIAGNOSIS — G459 Transient cerebral ischemic attack, unspecified: Secondary | ICD-10-CM | POA: Diagnosis not present

## 2018-01-05 DIAGNOSIS — I671 Cerebral aneurysm, nonruptured: Secondary | ICD-10-CM | POA: Diagnosis present

## 2018-01-05 LAB — HEMOGLOBIN A1C
Hgb A1c MFr Bld: 6.2 % — ABNORMAL HIGH (ref 4.8–5.6)
Mean Plasma Glucose: 131 mg/dL

## 2018-01-05 MED ORDER — ACETAMINOPHEN 325 MG PO TABS: 650.0000 mg | ORAL_TABLET | Freq: Four times a day (QID) | ORAL | 0 refills | Status: AC | PRN

## 2018-01-05 MED ORDER — GABAPENTIN 300 MG PO CAPS: 300.0000 mg | ORAL_CAPSULE | Freq: Every day | ORAL | Status: AC

## 2018-01-05 NOTE — Consult Note (Signed)
            [facility] CM Primary Care Navigator  01/05/2018  Tamyra Fojtik [birth year] [id number]   Attempt to seepatient at the bedside to identify possible discharge needs butshe wasalready dischargedhomeper staff. Per MD note, patient was moving to [place], [place] to her grandson's residence.  Per MD note,patient wasbrought to the ED since she was getting confused and not able to bring out words. She was admitted and underwent complete stroke evaluation. (TIA- transient ischemic attack, right internal carotid artery aneurysm)  Patient has discharge instruction to follow-up with primary care provider in 1- 2 weeks (please make and keep an appointment with a new primary care physician in [place], [place]).   Current primary care provider's office is listed as providing transition of care (TOC) follow-up.    For additional questions please contact:  Edwena Felty A. Dana Debo, BSN, RN-BC [facility] PRIMARY CARE Navigator Cell: [phone]

## 2018-01-05 NOTE — Discharge Summary (Signed)
Physician Discharge Summary  Jocelyn Ramirez FUX:[id number] DOB: Jan 08, 1929 DOA: 01/03/2018  PCP: Leeroy Cha, MD  Admit date: 01/03/2018 Discharge date: 01/05/2018  Admitted From: Home Disposition: Moving to [facility] to grandsons residence  Recommendations for Outpatient Follow-up:  1. Follow up with PCP in 1-2 weeks.  Please make and keep an appointment with a new primary care physician in [facility]. 2.   Patient will need home health in [place] until she is fully settled.  Home Health: Yes Equipment/Devices: Rolling walker with 5 inch wheels  Discharge Condition: Stable CODE STATUS: Full code Diet recommendation: Heart Healthy / Carb Modified / Regular / Dysphagia   Brief/Interim Summary:  Jocelyn Ramirez a 84 y.o.femalewithhistory of atrial fibrillation, lung cancer status post lobectomy in the 90s, bronchiectasis, peripheral neuropathy was brought to the ER after patient was found to be confused by the family. As per patient's grandson who provided the history patient's neighbors noticed that patient was getting confused and not able to bring out words since yesterday 4 PM. Patient's grandson was actually coming to meet the patient from [place]. On seeing the patient patient was found to be confused and had some difficulty bringing her worse and was brought to the ER. 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 Patient also states that she had a stroke last month but was not able to provide further history on that. Denies any weakness of the upper or lower extremities or any difficulty swallowing.  She was admitted and underwent complete stroke evaluation.  Evaluation unremarkable for causes of stroke but we did find a 5 mm x 3.5 mm right internal carotid artery aneurysm at the clinoid segment.  No further work-up was recommended for this.  Sickle therapy saw the patient and recommended either  skilled facility or transfer to grandson's home with 24-hour care.  At this point she and her grandson wish for her to move to [facility] where he lives.   Patient has reached maximal benefit of hospitalization.  Discharge diagnosis, prognosis, plans, follow-up, medications and treatments discussed with the patient(or responsible party) and is in agreement with the plans as described.  Patient is stable for discharge.  Discharge Diagnoses:  Principal Problem:   TIA (transient ischemic attack) Active Problems:   Bronchiectasis without acute exacerbation (HCC)   COPD with emphysema ([place])   Peripheral neuralgia   Permanent atrial fibrillation (HCC)   Right internal carotid artery aneurysm    Discharge Instructions  Discharge Instructions    Diet general   Complete by:  As directed    Discharge instructions   Complete by:  As directed    Please establish care with a new primary care provider in [place].  They can request records or obtain them through computerized methods.   Increase activity slowly   Complete by:  As directed      Allergies as of 01/05/2018      Reactions   Apixaban Nausea And Vomiting   Oxycodone Nausea And Vomiting   Penicillins Other (See Comments)   Unknown reaction Has patient had a PCN reaction causing immediate rash, facial/tongue/throat swelling, SOB or lightheadedness with hypotension: Unknown Has patient had a PCN reaction causing severe rash involving mucus membranes or skin necrosis: Unknown Has patient had a PCN reaction that required hospitalization: Unknown Has patient had a PCN reaction occurring within the last 10 years: Unknown If all of the above answers are "NO", then may proceed with Cephalosporin use.  Levofloxacin Rash      Medication List    STOP taking these medications   ibuprofen 200 MG tablet Commonly known as:  ADVIL,MOTRIN   meclizine 12.5 MG tablet Commonly known as:  ANTIVERT   scopolamine 1  MG/3DAYS Commonly known as:  TRANSDERM-SCOP     TAKE these medications   acetaminophen 325 MG tablet Commonly known as:  TYLENOL Take 2 tablets (650 mg total) by mouth every 6 (six) hours as needed for mild pain, fever or headache (or temp > 37.5 C (99.5 F)).   ADVAIR HFA 115-21 MCG/ACT inhaler Generic drug:  fluticasone-salmeterol USE 2 INHALATIONS ORALLY   TWICE DAILY What changed:  See the new instructions.   AEROCHAMBER MV inhaler Use as instructed   atorvastatin 10 MG tablet Commonly known as:  LIPITOR Take 10 mg by mouth daily.   bimatoprost 0.03 % ophthalmic solution Commonly known as:  LUMIGAN Place 1 drop into both eyes at bedtime.   dabigatran 150 MG Caps capsule Commonly known as:  PRADAXA Take 150 mg by mouth 2 (two) times daily.   diltiazem 120 MG 24 hr capsule Commonly known as:  CARDIZEM CD Take 120 mg by mouth 2 (two) times daily.   gabapentin 300 MG capsule Commonly known as:  NEURONTIN Take 1 capsule (300 mg total) by mouth 5 (five) times daily.   PRESERVISION AREDS 2+MULTI VIT PO Take 1 tablet by mouth 2 (two) times daily.   SIMBRINZA 1-0.2 % Susp Generic drug:  Brinzolamide-Brimonidine Place 1 drop into both eyes 3 (three) times daily.   timolol 0.25 % ophthalmic solution Commonly known as:  TIMOPTIC Place 1 drop into both eyes 2 (two) times daily.            Durable Medical Equipment  (From admission, onward)         Start     Ordered   01/04/18 1447  For home use only DME Walker rolling  Once    Question:  Patient needs a walker to treat with the following condition  Answer:  TIA (transient ischemic attack)   01/04/18 1447         Follow-up Information    Luanne Bras, MD. Schedule an appointment as soon as possible for a visit in 4 week(s).   Specialties:  Interventional Radiology, Radiology Contact information: [place] [phone]          Allergies  Allergen Reactions  .  Apixaban Nausea And Vomiting  . Oxycodone Nausea And Vomiting  . Penicillins Other (See Comments)    Unknown reaction Has patient had a PCN reaction causing immediate rash, facial/tongue/throat swelling, SOB or lightheadedness with hypotension: Unknown Has patient had a PCN reaction causing severe rash involving mucus membranes or skin necrosis: Unknown Has patient had a PCN reaction that required hospitalization: Unknown Has patient had a PCN reaction occurring within the last 10 years: Unknown If all of the above answers are "NO", then may proceed with Cephalosporin use.   . Levofloxacin Rash    Consultations:  Neurology   Procedures/Studies: Ct Angio Head W Or Wo Contrast  Result Date: 01/03/2018 CLINICAL DATA:  Aphasia EXAM: CT HEAD CODE STROKE WITHOUT CONTRAST CT ANGIOGRAPHY HEAD AND NECK CT PERFUSION BRAIN TECHNIQUE: Multidetector CT imaging of the head and neck was performed using the standard protocol during bolus administration of intravenous contrast. Multiplanar CT image reconstructions and MIPs were obtained to evaluate the vascular anatomy. Carotid stenosis measurements (when applicable) are obtained  utilizing NASCET criteria, using the distal internal carotid diameter as the denominator. Multiphase CT imaging of the brain was performed before and following IV bolus contrast injection. Subsequent parametric perfusion maps were calculated using RAPID software. CONTRAST:  126mL ISOVUE-370 IOPAMIDOL (ISOVUE-370) INJECTION 76% COMPARISON:  None. FINDINGS: CT HEAD FINDINGS Brain: There is no mass, hemorrhage or extra-axial collection. There is generalized atrophy without lobar predilection. There are old right gangliothalamic lacunar infarcts. There is hypoattenuation of the periventricular white matter, most commonly indicating chronic ischemic microangiopathy. Skull: The visualized skull base, calvarium and extracranial soft tissues are normal. Sinuses/Orbits: No fluid levels or  advanced mucosal thickening of the visualized paranasal sinuses. No mastoid or middle ear effusion. The orbits are normal. ASPECTS ([place] Stroke Program Early CT Score) - Ganglionic level infarction (caudate, lentiform nuclei, internal capsule, insula, M1-M3 cortex): 7 - Supraganglionic infarction (M4-M6 cortex): 3 Total score (0-10 with 10 being normal): 10 Review of the MIP images confirms the above findings CTA NECK FINDINGS AORTIC ARCH: There is mild calcific atherosclerosis of the aortic arch. There is no aneurysm, dissection or hemodynamically significant stenosis of the visualized ascending aorta and aortic arch. Conventional 3 vessel aortic branching pattern. The visualized proximal subclavian arteries are widely patent. RIGHT CAROTID SYSTEM: --Common carotid artery: Widely patent origin without common carotid artery dissection or aneurysm. --Internal carotid artery: No dissection, occlusion or aneurysm. No hemodynamically significant stenosis. --External carotid artery: No acute abnormality. LEFT CAROTID SYSTEM: --Common carotid artery: Widely patent origin without common carotid artery dissection or aneurysm. --Internal carotid artery:No dissection, occlusion or aneurysm. No hemodynamically significant stenosis. --External carotid artery: No acute abnormality. VERTEBRAL ARTERIES: Left dominant configuration. Both origins are normal. No dissection, occlusion or flow-limiting stenosis to the vertebrobasilar confluence. SKELETON: There is no bony spinal canal stenosis. No lytic or blastic lesion. OTHER NECK: Normal pharynx, larynx and major salivary glands. No cervical lymphadenopathy. Unremarkable thyroid gland. UPPER CHEST: No pneumothorax or pleural effusion. No nodules or masses. CTA HEAD FINDINGS ANTERIOR CIRCULATION: --Intracranial internal carotid arteries: There is a posteriorly projecting aneurysm of the clinoid segment of the right internal carotid artery that measures 5 mm at its base and 3.5 mm  base to apex (series 10, image 87). There is atherosclerotic calcification of both intracranial internal carotid arteries without hemodynamically significant stenosis. --Anterior cerebral arteries: Normal. Absent left A1 segment, normal variant. A-comm is patent. --Middle cerebral arteries: Normal. --Posterior communicating arteries: Absent bilaterally. POSTERIOR CIRCULATION: --Basilar artery: Normal. --Posterior cerebral arteries: Normal. --Superior cerebellar arteries: Normal. --Inferior cerebellar arteries: Normal anterior and posterior inferior cerebellar arteries. VENOUS SINUSES: As permitted by contrast timing, patent. ANATOMIC VARIANTS: None DELAYED PHASE: Not performed. Review of the MIP images confirms the above findings. CT Brain Perfusion Findings: CBF (<30%) Volume: 39mL Perfusion (Tmax>6.0s) volume: 56mL Mismatch Volume: 75mL Infarction Location:None IMPRESSION: 1. No intracranial hemorrhage.  ASPECTS is 10. 2. No emergent large vessel occlusion or hemodynamically significant stenosis of the head or neck. 3. 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 4. No ischemia demonstrated by CT perfusion analysis. These results were called by telephone at the time of interpretation on 01/03/2018 at 10:40 pm to Dr. Kerney Elbe, who verbally acknowledged these results. Electronically Signed   By: Ulyses Jarred M.D.   On: 01/03/2018 22:52   Ct Angio Neck W Or Wo Contrast  Result Date: 01/03/2018 CLINICAL DATA:  Aphasia EXAM: CT HEAD CODE STROKE WITHOUT CONTRAST CT ANGIOGRAPHY HEAD  AND NECK CT PERFUSION BRAIN TECHNIQUE: Multidetector CT imaging of the head and neck was performed using the standard protocol during bolus administration of intravenous contrast. Multiplanar CT image reconstructions and MIPs were obtained to evaluate the vascular anatomy. Carotid stenosis measurements (when applicable) are obtained utilizing NASCET  criteria, using the distal internal carotid diameter as the denominator. Multiphase CT imaging of the brain was performed before and following IV bolus contrast injection. Subsequent parametric perfusion maps were calculated using RAPID software. CONTRAST:  141mL ISOVUE-370 IOPAMIDOL (ISOVUE-370) INJECTION 76% COMPARISON:  None. FINDINGS: CT HEAD FINDINGS Brain: There is no mass, hemorrhage or extra-axial collection. There is generalized atrophy without lobar predilection. There are old right gangliothalamic lacunar infarcts. There is hypoattenuation of the periventricular white matter, most commonly indicating chronic ischemic microangiopathy. Skull: The visualized skull base, calvarium and extracranial soft tissues are normal. Sinuses/Orbits: No fluid levels or advanced mucosal thickening of the visualized paranasal sinuses. No mastoid or middle ear effusion. The orbits are normal. ASPECTS ([place] Stroke Program Early CT Score) - Ganglionic level infarction (caudate, lentiform nuclei, internal capsule, insula, M1-M3 cortex): 7 - Supraganglionic infarction (M4-M6 cortex): 3 Total score (0-10 with 10 being normal): 10 Review of the MIP images confirms the above findings CTA NECK FINDINGS AORTIC ARCH: There is mild calcific atherosclerosis of the aortic arch. There is no aneurysm, dissection or hemodynamically significant stenosis of the visualized ascending aorta and aortic arch. Conventional 3 vessel aortic branching pattern. The visualized proximal subclavian arteries are widely patent. RIGHT CAROTID SYSTEM: --Common carotid artery: Widely patent origin without common carotid artery dissection or aneurysm. --Internal carotid artery: No dissection, occlusion or aneurysm. No hemodynamically significant stenosis. --External carotid artery: No acute abnormality. LEFT CAROTID SYSTEM: --Common carotid artery: Widely patent origin without common carotid artery dissection or aneurysm. --Internal carotid artery:No  dissection, occlusion or aneurysm. No hemodynamically significant stenosis. --External carotid artery: No acute abnormality. VERTEBRAL ARTERIES: Left dominant configuration. Both origins are normal. No dissection, occlusion or flow-limiting stenosis to the vertebrobasilar confluence. SKELETON: There is no bony spinal canal stenosis. No lytic or blastic lesion. OTHER NECK: Normal pharynx, larynx and major salivary glands. No cervical lymphadenopathy. Unremarkable thyroid gland. UPPER CHEST: No pneumothorax or pleural effusion. No nodules or masses. 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 There is atherosclerotic calcification of both intracranial internal carotid arteries without hemodynamically significant stenosis. --Anterior cerebral arteries: Normal. Absent left A1 segment, normal variant. A-comm is patent. --Middle cerebral arteries: Normal. --Posterior communicating arteries: Absent bilaterally. POSTERIOR CIRCULATION: --Basilar artery: Normal. --Posterior cerebral arteries: Normal. --Superior cerebellar arteries: Normal. --Inferior cerebellar arteries: Normal anterior and posterior inferior cerebellar arteries. VENOUS SINUSES: As permitted by contrast timing, patent. ANATOMIC VARIANTS: None DELAYED PHASE: Not performed. Review of the MIP images confirms the above findings. CT Brain Perfusion Findings: CBF (<30%) Volume: 29mL Perfusion (Tmax>6.0s) volume: 42mL Mismatch Volume: 19mL Infarction Location:None IMPRESSION: 1. No intracranial hemorrhage.  ASPECTS is 10. 2. No emergent large vessel occlusion or hemodynamically significant stenosis of the head or neck. 3. 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 4.  No ischemia demonstrated by CT perfusion analysis. These results were called by telephone at the time of interpretation on 01/03/2018 at 10:40 pm to Dr. Randall Hiss  Lindzen, who verbally acknowledged these results. Electronically Signed   By: Ulyses Jarred M.D.   On: 01/03/2018 22:52   Mr Brain Wo Contrast  Result Date: 01/04/2018 CLINICAL DATA:  Aphasia EXAM: MRI HEAD WITHOUT CONTRAST TECHNIQUE: Multiplanar, multiecho pulse sequences of the brain and surrounding structures were obtained without intravenous contrast. COMPARISON:  Head CT/CTA/CTP 01/03/2018 FINDINGS: BRAIN: There is no acute infarct, acute hemorrhage or mass effect. The midline structures are normal. There are multiple old cerebellar infarcts, more so on the right. There are old bilateral ganglia thalamic infarcts. Early confluent hyperintense T2-weighted signal of the periventricular and deep white matter, most commonly due to chronic ischemic microangiopathy. Generalized atrophy without lobar predilection. Susceptibility-sensitive sequences show no chronic microhemorrhage or superficial siderosis. VASCULAR: Major intracranial arterial and venous sinus flow voids are preserved. SKULL AND UPPER CERVICAL SPINE: The visualized skull base, calvarium, upper cervical spine and extracranial soft tissues are normal. SINUSES/ORBITS: No fluid levels or advanced mucosal thickening. No mastoid or middle ear effusion. The orbits are normal. IMPRESSION: 1. No acute intracranial abnormality. 2. Multiple old cerebellar, basal ganglia and thalamus lacunar infarcts. 3. Atrophy and chronic ischemic microangiopathy. Electronically Signed   By: Ulyses Jarred M.D.   On: 01/04/2018 05:29   Ct Cerebral Perfusion W Contrast  Result Date: 01/03/2018 CLINICAL DATA:  Aphasia EXAM: CT HEAD CODE STROKE WITHOUT CONTRAST CT ANGIOGRAPHY HEAD AND NECK CT PERFUSION BRAIN TECHNIQUE: Multidetector CT imaging of the head and neck was performed using the standard protocol during bolus  administration of intravenous contrast. Multiplanar CT image reconstructions and MIPs were obtained to evaluate the vascular anatomy. Carotid stenosis measurements (when applicable) are obtained utilizing NASCET criteria, using the distal internal carotid diameter as the denominator. Multiphase CT imaging of the brain was performed before and following IV bolus contrast injection. Subsequent parametric perfusion maps were calculated using RAPID software. CONTRAST:  140mL ISOVUE-370 IOPAMIDOL (ISOVUE-370) INJECTION 76% COMPARISON:  None. FINDINGS: CT HEAD FINDINGS Brain: There is no mass, hemorrhage or extra-axial collection. There is generalized atrophy without lobar predilection. There are old right gangliothalamic lacunar infarcts. There is hypoattenuation of the periventricular white matter, most commonly indicating chronic ischemic microangiopathy. Skull: The visualized skull base, calvarium and extracranial soft tissues are normal. Sinuses/Orbits: No fluid levels or advanced mucosal thickening of the visualized paranasal sinuses. No mastoid or middle ear effusion. The orbits are normal. ASPECTS ([place] Stroke Program Early CT Score) - Ganglionic level infarction (caudate, lentiform nuclei, internal capsule, insula, M1-M3 cortex): 7 - Supraganglionic infarction (M4-M6 cortex): 3 Total score (0-10 with 10 being normal): 10 Review of the MIP images confirms the above findings CTA NECK FINDINGS AORTIC ARCH: There is mild calcific atherosclerosis of the aortic arch. There is no aneurysm, dissection or hemodynamically significant stenosis of the visualized ascending aorta and aortic arch. Conventional 3 vessel aortic branching pattern. The visualized proximal subclavian arteries are widely patent. RIGHT CAROTID SYSTEM: --Common carotid artery: Widely patent origin without common carotid artery dissection or aneurysm. --Internal carotid artery: No dissection, occlusion or aneurysm. No hemodynamically significant  stenosis. --External carotid artery: No acute abnormality. LEFT CAROTID SYSTEM: --Common carotid artery: Widely patent origin without common carotid artery dissection or aneurysm. --Internal carotid artery:No dissection, occlusion or aneurysm. No hemodynamically significant stenosis. --External carotid artery: No acute abnormality. VERTEBRAL ARTERIES: Left dominant configuration. Both origins are normal. No dissection, occlusion or flow-limiting stenosis to the vertebrobasilar confluence. SKELETON: There is no bony spinal canal stenosis. No lytic or blastic lesion. OTHER NECK: Normal pharynx, larynx  and major salivary glands. No cervical lymphadenopathy. Unremarkable thyroid gland. UPPER CHEST: No pneumothorax or pleural effusion. No nodules or masses. 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 There is atherosclerotic calcification of both intracranial internal carotid arteries without hemodynamically significant stenosis. --Anterior cerebral arteries: Normal. Absent left A1 segment, normal variant. A-comm is patent. --Middle cerebral arteries: Normal. --Posterior communicating arteries: Absent bilaterally. POSTERIOR CIRCULATION: --Basilar artery: Normal. --Posterior cerebral arteries: Normal. --Superior cerebellar arteries: Normal. --Inferior cerebellar arteries: Normal anterior and posterior inferior cerebellar arteries. VENOUS SINUSES: As permitted by contrast timing, patent. ANATOMIC VARIANTS: None DELAYED PHASE: Not performed. Review of the MIP images confirms the above findings. CT Brain Perfusion Findings: CBF (<30%) Volume: 11mL Perfusion (Tmax>6.0s) volume: 73mL Mismatch Volume: 80mL Infarction Location:None IMPRESSION: 1. No intracranial hemorrhage.  ASPECTS is 10. 2. No emergent large vessel  occlusion or hemodynamically significant stenosis of the head or neck. 3. 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 4. No ischemia demonstrated by CT perfusion analysis. These results were called by telephone at the time of interpretation on 01/03/2018 at 10:40 pm to Dr. Kerney Elbe, who verbally acknowledged these results. Electronically Signed   By: Ulyses Jarred M.D.   On: 01/03/2018 22:52   Dg Chest Port 1 View  Result Date: 01/04/2018 CLINICAL DATA:  Shortness of breath.  Confusion. EXAM: PORTABLE CHEST 1 VIEW COMPARISON:  Radiograph 12/03/2017, 02/09/2015. FINDINGS: Unchanged heart size and mediastinal contours. Surgical clips at the right hilum with postsurgical volume loss in the right lung. Again seen emphysema. No superimposed pulmonary edema, focal airspace disease, pleural effusion or pneumothorax. No acute osseous abnormalities are seen. IMPRESSION: 1. No acute findings. 2. Postsurgical change and emphysema, stable in appearance from prior exam. Electronically Signed   By: Keith Rake M.D.   On: 01/04/2018 06:58   Ct Head Code Stroke Wo Contrast`  Result Date: 01/03/2018 CLINICAL DATA:  Aphasia EXAM: CT HEAD CODE STROKE WITHOUT CONTRAST CT ANGIOGRAPHY HEAD AND NECK CT PERFUSION BRAIN TECHNIQUE: Multidetector CT imaging of the head and neck was performed using the standard protocol during bolus administration of intravenous contrast. Multiplanar CT image reconstructions and MIPs were obtained to evaluate the vascular anatomy. Carotid stenosis measurements (when applicable) are obtained utilizing NASCET criteria, using the distal internal carotid diameter as the denominator. Multiphase CT imaging of the brain was performed before and following IV bolus contrast injection. Subsequent parametric perfusion maps were calculated using RAPID software. CONTRAST:  16mL ISOVUE-370 IOPAMIDOL (ISOVUE-370) INJECTION 76%  COMPARISON:  None. FINDINGS: CT HEAD FINDINGS Brain: There is no mass, hemorrhage or extra-axial collection. There is generalized atrophy without lobar predilection. There are old right gangliothalamic lacunar infarcts. There is hypoattenuation of the periventricular white matter, most commonly indicating chronic ischemic microangiopathy. Skull: The visualized skull base, calvarium and extracranial soft tissues are normal. Sinuses/Orbits: No fluid levels or advanced mucosal thickening of the visualized paranasal sinuses. No mastoid or middle ear effusion. The orbits are normal. ASPECTS ([place] Stroke Program Early CT Score) - Ganglionic level infarction (caudate, lentiform nuclei, internal capsule, insula, M1-M3 cortex): 7 - Supraganglionic infarction (M4-M6 cortex): 3 Total score (0-10 with 10 being normal): 10 Review of the MIP images confirms the above findings CTA NECK FINDINGS AORTIC  ARCH: There is mild calcific atherosclerosis of the aortic arch. There is no aneurysm, dissection or hemodynamically significant stenosis of the visualized ascending aorta and aortic arch. Conventional 3 vessel aortic branching pattern. The visualized proximal subclavian arteries are widely patent. RIGHT CAROTID SYSTEM: --Common carotid artery: Widely patent origin without common carotid artery dissection or aneurysm. --Internal carotid artery: No dissection, occlusion or aneurysm. No hemodynamically significant stenosis. --External carotid artery: No acute abnormality. LEFT CAROTID SYSTEM: --Common carotid artery: Widely patent origin without common carotid artery dissection or aneurysm. --Internal carotid artery:No dissection, occlusion or aneurysm. No hemodynamically significant stenosis. --External carotid artery: No acute abnormality. VERTEBRAL ARTERIES: Left dominant configuration. Both origins are normal. No dissection, occlusion or flow-limiting stenosis to the vertebrobasilar confluence. SKELETON: There is no bony spinal  canal stenosis. No lytic or blastic lesion. OTHER NECK: Normal pharynx, larynx and major salivary glands. No cervical lymphadenopathy. Unremarkable thyroid gland. UPPER CHEST: No pneumothorax or pleural effusion. No nodules or masses. 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 There is atherosclerotic calcification of both intracranial internal carotid arteries without hemodynamically significant stenosis. --Anterior cerebral arteries: Normal. Absent left A1 segment, normal variant. A-comm is patent. --Middle cerebral arteries: Normal. --Posterior communicating arteries: Absent bilaterally. POSTERIOR CIRCULATION: --Basilar artery: Normal. --Posterior cerebral arteries: Normal. --Superior cerebellar arteries: Normal. --Inferior cerebellar arteries: Normal anterior and posterior inferior cerebellar arteries. VENOUS SINUSES: As permitted by contrast timing, patent. ANATOMIC VARIANTS: None DELAYED PHASE: Not performed. Review of the MIP images confirms the above findings. CT Brain Perfusion Findings: CBF (<30%) Volume: 28mL Perfusion (Tmax>6.0s) volume: 27mL Mismatch Volume: 43mL Infarction Location:None IMPRESSION: 1. No intracranial hemorrhage.  ASPECTS is 10. 2. No emergent large vessel occlusion or hemodynamically significant stenosis of the head or neck. 3. 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 4. No ischemia demonstrated by CT perfusion analysis. These results were called by telephone at the time of interpretation on 01/03/2018 at 10:40 pm to Dr. Kerney Elbe, who verbally acknowledged these results. Electronically Signed   By: Ulyses Jarred M.D.   On: 01/03/2018 22:52   Echocardiogram: - Left ventricle: The  cavity size was normal. Systolic function was   normal. The estimated ejection fraction was in the range of 55%   to 60%. Wall motion was normal; there were no regional wall   motion abnormalities. - Aortic valve: Transvalvular velocity was within the normal range.   There was no stenosis. There was no regurgitation. - Mitral valve: Transvalvular velocity was within the normal range.   There was no evidence for stenosis. There was trivial   regurgitation. - Left atrium: The atrium was mildly dilated. - Right ventricle: Systolic function was normal. - Tricuspid valve: There was no regurgitation.  Subjective: Patient significantly better today.  Discharge Exam: Vitals:   01/05/18 0800 01/05/18 0822  BP: (!) 97/54   Pulse: (!) 46   Resp: 19   Temp:    SpO2: 93% 97%   Vitals:   01/05/18 0434 01/05/18 0700 01/05/18 0800 01/05/18 0822  BP: (!) 95/57  (!) 97/54   Pulse: 78  (!) 46   Resp: 19  19   Temp: 98 F (36.7 C) 98 F (36.7 C)    TempSrc: Oral  SpO2: 96%  93% 97%  Weight:      Height:        General: Pt is alert, awake, not in acute distress Cardiovascular: RRR, S1/S2 +, no rubs, no gallops Respiratory: CTA bilaterally, no wheezing, no rhonchi Abdominal: Soft, NT, ND, bowel sounds + Extremities: no edema, no cyanosis    The results of significant diagnostics from this hospitalization (including imaging, microbiology, ancillary and laboratory) are listed below for reference.     Microbiology: No results found for this or any previous visit (from the past 240 hour(s)).   Labs: BNP (last 3 results) No results for input(s): BNP in the last 8760 hours. Basic Metabolic Panel: Recent Labs  Lab 01/03/18 2039 01/03/18 2129 01/04/18 0525  NA 139 140 139  K 4.2 3.9 3.8  CL 106 106 110  CO2 22  --  19*  GLUCOSE 168* 161* 120*  BUN 16 18 11   CREATININE 0.78 0.60 0.63  CALCIUM 9.6  --  9.2   Liver Function Tests: Recent Labs  Lab 01/03/18 2039  01/04/18 0525  AST 22 17  ALT 12 12  ALKPHOS 54 58  BILITOT 1.1 1.2  PROT 6.4* 6.5  ALBUMIN 3.6 3.4*   No results for input(s): LIPASE, AMYLASE in the last 168 hours. No results for input(s): AMMONIA in the last 168 hours. CBC: Recent Labs  Lab 01/03/18 2039 01/03/18 2116 01/03/18 2129 01/04/18 0525  WBC 17.6*  --   --  14.3*  NEUTROABS  --  14.4*  --   --   HGB 12.6  --  13.3 12.7  HCT 39.9  --  39.0 40.2  MCV 90.7  --   --  89.3  PLT 223  --   --  199   Cardiac Enzymes: No results for input(s): CKTOTAL, CKMB, CKMBINDEX, TROPONINI in the last 168 hours. BNP: Invalid input(s): POCBNP CBG: Recent Labs  Lab 01/03/18 2044  GLUCAP 155*   D-Dimer No results for input(s): DDIMER in the last 72 hours. Hgb A1c Recent Labs    01/04/18 0529  HGBA1C 6.2*   Lipid Profile Recent Labs    01/04/18 0525  CHOL 136  HDL 58  LDLCALC 69  TRIG 47  CHOLHDL 2.3   Thyroid function studies No results for input(s): TSH, T4TOTAL, T3FREE, THYROIDAB in the last 72 hours.  Invalid input(s): FREET3 Anemia work up No results for input(s): VITAMINB12, FOLATE, FERRITIN, TIBC, IRON, RETICCTPCT in the last 72 hours. Urinalysis    Component Value Date/Time   COLORURINE YELLOW 01/03/2018 2136   APPEARANCEUR CLEAR 01/03/2018 2136   LABSPEC 1.010 01/03/2018 2136   PHURINE 9.0 (H) 01/03/2018 2136   GLUCOSEU NEGATIVE 01/03/2018 2136   HGBUR SMALL (A) 01/03/2018 2136   BILIRUBINUR NEGATIVE 01/03/2018 2136   KETONESUR NEGATIVE 01/03/2018 2136   PROTEINUR NEGATIVE 01/03/2018 2136   NITRITE NEGATIVE 01/03/2018 2136   LEUKOCYTESUR NEGATIVE 01/03/2018 2136   Sepsis Labs Invalid input(s): PROCALCITONIN,  WBC,  LACTICIDVEN Microbiology No results found for this or any previous visit (from the past 240 hour(s)).   Time coordinating discharge: 42 minutes  SIGNED:   Lady Deutscher, MD  FACP [facility] 01/05/2018, 10:49 AM Pager   If 7PM-7AM, please contact  night-coverage [email or web address] Password [email or web address]

## 2018-01-05 NOTE — Plan of Care (Signed)

## 2018-01-05 NOTE — Plan of Care (Signed)
  Problem: Education: Goal: Knowledge of General Education information will improve Description Including pain rating scale, medication(s)/side effects and non-pharmacologic comfort measures 01/05/2018 1348 by Lawanda Cousins, RN Outcome: Completed/Met 01/05/2018 1141 by Lawanda Cousins, RN Outcome: Progressing   Problem: Health Behavior/Discharge Planning: Goal: Ability to manage health-related needs will improve 01/05/2018 1348 by Lawanda Cousins, RN Outcome: Completed/Met 01/05/2018 1141 by Lawanda Cousins, RN Outcome: Progressing   Problem: Clinical Measurements: Goal: Ability to maintain clinical measurements within normal limits will improve 01/05/2018 1348 by Lawanda Cousins, RN Outcome: Completed/Met 01/05/2018 1141 by Lawanda Cousins, RN Outcome: Progressing Goal: Will remain free from infection 01/05/2018 1348 by Lawanda Cousins, RN Outcome: Completed/Met 01/05/2018 1141 by Lawanda Cousins, RN Outcome: Progressing Goal: Diagnostic test results will improve 01/05/2018 1348 by Lawanda Cousins, RN Outcome: Completed/Met 01/05/2018 1141 by Lawanda Cousins, RN Outcome: Progressing Goal: Respiratory complications will improve 01/05/2018 1348 by Lawanda Cousins, RN Outcome: Completed/Met 01/05/2018 1141 by Lawanda Cousins, RN Outcome: Progressing Goal: Cardiovascular complication will be avoided 01/05/2018 1348 by Lawanda Cousins, RN Outcome: Completed/Met 01/05/2018 1141 by Lawanda Cousins, RN Outcome: Progressing   Problem: Activity: Goal: Risk for activity intolerance will decrease 01/05/2018 1348 by Lawanda Cousins, RN Outcome: Completed/Met 01/05/2018 1141 by Lawanda Cousins, RN Outcome: Progressing   Problem: Nutrition: Goal: Adequate nutrition will be maintained 01/05/2018 1348 by Lawanda Cousins, RN Outcome: Completed/Met 01/05/2018 1141 by Lawanda Cousins, RN Outcome: Progressing   Problem: Coping: Goal: Level of anxiety will decrease 01/05/2018 1348 by Lawanda Cousins, RN Outcome:  Completed/Met 01/05/2018 1141 by Lawanda Cousins, RN Outcome: Progressing   Problem: Elimination: Goal: Will not experience complications related to bowel motility 01/05/2018 1348 by Lawanda Cousins, RN Outcome: Completed/Met 01/05/2018 1141 by Lawanda Cousins, RN Outcome: Progressing Goal: Will not experience complications related to urinary retention 01/05/2018 1348 by Lawanda Cousins, RN Outcome: Completed/Met 01/05/2018 1141 by Lawanda Cousins, RN Outcome: Progressing   Problem: Pain Managment: Goal: General experience of comfort will improve 01/05/2018 1348 by Lawanda Cousins, RN Outcome: Completed/Met 01/05/2018 1141 by Lawanda Cousins, RN Outcome: Progressing   Problem: Safety: Goal: Ability to remain free from injury will improve 01/05/2018 1348 by Lawanda Cousins, RN Outcome: Completed/Met 01/05/2018 1141 by Lawanda Cousins, RN Outcome: Progressing   Problem: Skin Integrity: Goal: Risk for impaired skin integrity will decrease 01/05/2018 1348 by Lawanda Cousins, RN Outcome: Completed/Met 01/05/2018 1141 by Lawanda Cousins, RN Outcome: Progressing

## 2018-01-05 NOTE — Evaluation (Signed)
Occupational Therapy Evaluation Patient Details Name: Jocelyn Ramirez MRN: [id number] DOB: [birth year] Today's Date: 01/05/2018    History of Present Illness Pt is an 84 y/o female admitted secondary to increased confusion and word finding difficulty. Suspect TIA. 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 PMH includes a fib, lung cancer s/p lobectomy, and CVA with R eye blindness.    Clinical Impression   PTA, pt was living alone and independent with occasional use of cane for for ADL and functional mobility. She has a history of R eye blindness. She currently is limited by generalized weakness and decreased activity tolerance for ADL participation. She has a history of cognitive deficits at baseline and noted tangential speech, decreased attention, and difficulty sequencing today. Pt would benefit form 24 hour assistance post-acute D/C as well as home health OT follow-up to maximize independence and safety with ADL and functional mobility. She plans to discharge to her grandson's home. Will continue to follow while admitted.     Follow Up Recommendations  Home health OT;Supervision/Assistance - 24 hour    Equipment Recommendations  3 in 1 bedside commode    Recommendations for Other Services       Precautions / Restrictions Precautions Precautions: Fall Restrictions Weight Bearing Restrictions: No      Mobility Bed Mobility               General bed mobility comments: OOB in chair on my arrival.   Transfers Overall transfer level: Needs assistance Equipment used: Rolling walker (2 wheeled) Transfers: Sit to/from Stand Sit to Stand: Supervision         General transfer comment: Supervision to power up to stnading. Guarding assist once standing.     Balance Overall balance assessment: Needs assistance Sitting-balance support: No upper extremity supported;Feet supported Sitting balance-Leahy Scale: Good     Standing balance  support: Bilateral upper extremity supported;During functional activity Standing balance-Leahy Scale: Fair Standing balance comment: Able to statically stand without UE support.                            ADL either performed or assessed with clinical judgement   ADL Overall ADL's : Needs assistance/impaired Eating/Feeding: Supervision/ safety;Sitting   Grooming: Supervision/safety;Standing   Upper Body Bathing: Supervision/ safety;Sitting   Lower Body Bathing: Supervison/ safety;Sit to/from stand   Upper Body Dressing : Supervision/safety;Sitting   Lower Body Dressing: Supervision/safety;Sit to/from stand   Toilet Transfer: Min guard;RW;Ambulation   Toileting- Water quality scientist and Hygiene: Min guard;Sit to/from stand       Functional mobility during ADLs: Min guard;Rolling walker General ADL Comments: Pt able to ambulate into bathroom for hand washing tasks. She required cues to sequence and problem solve during tasks today.      Vision Baseline Vision/History: Wears glasses Wears Glasses: At all times("most of the time") Patient Visual Report: No change from baseline Vision Assessment?: No apparent visual deficits     Perception     Praxis      Pertinent Vitals/Pain Pain Assessment: No/denies pain     Hand Dominance Right   Extremity/Trunk Assessment Upper Extremity Assessment Upper Extremity Assessment: Generalized weakness   Lower Extremity Assessment Lower Extremity Assessment: Generalized weakness   Cervical / Trunk Assessment Cervical / Trunk Assessment: Kyphotic   Communication Communication Communication: No difficulties   Cognition Arousal/Alertness: Awake/alert Behavior During Therapy: WFL for tasks assessed/performed Overall Cognitive Status: No family/caregiver present to determine baseline cognitive  functioning Area of Impairment: Attention;Problem solving;Awareness                   Current Attention Level:  Selective       Awareness: Emergent Problem Solving: Slow processing;Decreased initiation;Difficulty sequencing General Comments: Pt with history of previous CVA with cognitive deficits. She is highly tangential but likely at or near baseline.    General Comments  No family present during session.     Exercises     Shoulder Instructions      Home Living Family/patient expects to be discharged to:: Private residence(grandson's home) Living Arrangements: Other (Comment)(discharging to grandson's home) Available Help at Discharge: Family;Available PRN/intermittently Type of Home: House Home Access: Stairs to enter [employer] of Steps: 3   Home Layout: One level     Bathroom Shower/Tub: Occupational psychologist: Standard     Home Equipment: Cane - single point   Additional Comments: Per RN and pt, pt is planning to move in to grandson's home where she will have increased assistance.       Prior Functioning/Environment Level of Independence: Independent with assistive device(s)        Comments: Reports using cane to walk outside of house and was not using AD for inside. Per RN, pt using furniture to walk inside the house.         OT Problem List: Decreased strength;Decreased range of motion;Decreased activity tolerance;Impaired balance (sitting and/or standing);Decreased cognition;Decreased safety awareness;Decreased knowledge of use of DME or AE;Decreased knowledge of precautions;Pain      OT Treatment/Interventions: Self-care/ADL training;Therapeutic exercise;Energy conservation;DME and/or AE instruction;Therapeutic activities;Patient/family education;Balance training;Cognitive remediation/compensation    OT Goals(Current goals can be found in the care plan section) Acute Rehab OT Goals Patient Stated Goal: "to walk like I normally do" OT Goal Formulation: With patient Time For Goal Achievement: 01/19/18 Potential to Achieve Goals: Good ADL  Goals Pt Will Perform Grooming: with modified independence;standing Pt Will Perform Upper Body Dressing: with modified independence;sitting Pt Will Perform Lower Body Dressing: with modified independence;sit to/from stand Pt Will Transfer to Toilet: with modified independence;ambulating;regular height toilet Pt Will Perform Toileting - Clothing Manipulation and hygiene: with modified independence;sit to/from stand Pt Will Perform Tub/Shower Transfer: with modified independence;ambulating;rolling walker;Shower transfer;3 in 1  OT Frequency: Min 2X/week   Barriers to D/C:            Co-evaluation              AM-PAC PT "6 Clicks" Daily Activity     Outcome Measure Help from another person eating meals?: None Help from another person taking care of personal grooming?: None Help from another person toileting, which includes using toliet, bedpan, or urinal?: A Little Help from another person bathing (including washing, rinsing, drying)?: A Little Help from another person to put on and taking off regular upper body clothing?: None Help from another person to put on and taking off regular lower body clothing?: None 6 Click Score: 22   End of Session Equipment Utilized During Treatment: Gait belt;Rolling walker Nurse Communication: Mobility status  Activity Tolerance: Patient tolerated treatment well Patient left: Other (comment)(ambulating in hallway with PT)  OT Visit Diagnosis: Other abnormalities of gait and mobility (R26.89);Other symptoms and signs involving cognitive function                Time: 1058-1110 OT Time Calculation (min): 12 min Charges:  OT General Charges $OT Visit: 1 Visit OT Evaluation $OT Eval Moderate Complexity: 1  [address] [place], OTR/L [place] Pager [phone] Office [phone]   Norman Herrlich 01/05/2018, 11:44 AM

## 2018-01-05 NOTE — Progress Notes (Signed)
Physical Therapy Treatment Patient Details Name: Jocelyn Ramirez MRN: [id number] DOB: 1929-03-11 Today's Date: 01/05/2018    History of Present Illness Pt is an 84 y/o female admitted secondary to increased confusion and word finding difficulty. Suspect TIA. 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 PMH includes a fib, lung cancer s/p lobectomy, and CVA with R eye blindness.     PT Comments    Patient tolerated therapy well. Ambulated in hallway and performed stair negotiation. Anticipate patient will be safe for d/c home with family assist. Current POC remains appropriate.   Follow Up Recommendations  Home health PT     Equipment Recommendations  Rolling walker with 5" wheels    Recommendations for Other Services       Precautions / Restrictions Precautions Precautions: Fall Restrictions Weight Bearing Restrictions: No    Mobility  Bed Mobility               General bed mobility comments: OOB in chair at time of therapy   Transfers Overall transfer level: Needs assistance Equipment used: Rolling walker (2 wheeled) Transfers: Sit to/from Stand Sit to Stand: Supervision         General transfer comment: Supervision for safety  Ambulation/Gait Ambulation/Gait assistance: Supervision Gait Distance (Feet): 130 Feet Assistive device: Rolling walker (2 wheeled) Gait Pattern/deviations: Step-through pattern;Decreased stride length Gait velocity:   Gait velocity interpretation: <1.8 ft/sec, indicate of risk for recurrent falls General Gait Details: Slow, mildly unsteady gait. Pt requested to sit during AMB due to fatigue HR was 94 BPM Pt able to AMB w/o walker  25 ft    Stairs Stairs: Yes Stairs assistance: Min guard Stair Management: One rail Right;Step to pattern Number of Stairs: 2 General stair comments: Min guard for safety heavy relaince on rail   Wheelchair Mobility    Modified Rankin (Stroke Patients  Only) Modified Rankin (Stroke Patients Only) Pre-Morbid Rankin Score: Slight disability Modified Rankin: Moderately severe disability     Balance Overall balance assessment: Needs assistance Sitting-balance support: No upper extremity supported Sitting balance-Leahy Scale: Good     Standing balance support: No upper extremity supported Standing balance-Leahy Scale: Fair Standing balance comment: able to static stand without physical assist                            Cognition Arousal/Alertness: Awake/alert Behavior During Therapy: WFL for tasks assessed/performed Overall Cognitive Status: No family/caregiver present to determine baseline cognitive functioning Area of Impairment: Attention;Awareness;Problem solving                   Current Attention Level: Selective       Awareness: Emergent Problem Solving: Slow processing;Decreased initiation;Difficulty sequencing        Exercises      General Comments General comments (skin integrity, edema, etc.): No family present during session       Pertinent Vitals/Pain Pain Assessment: No/denies pain    Home Living                      Prior Function            PT Goals (current goals can now be found in the care plan section) Acute Rehab PT Goals Patient Stated Goal: "to walk like I normally do" PT Goal Formulation: With patient Time For Goal Achievement: 01/18/18 Potential to Achieve Goals: Fair Progress towards PT goals: Progressing toward goals  Frequency    Min 3X/week      PT Plan Current plan remains appropriate    Co-evaluation              AM-PAC PT "6 Clicks" Daily Activity  Outcome Measure  Difficulty turning over in bed (including adjusting bedclothes, sheets and blankets)?: A Little Difficulty moving from lying on back to sitting on the side of the bed? : A Little Difficulty sitting down on and standing up from a chair with arms (e.g., wheelchair,  bedside commode, etc,.)?: A Little Help needed moving to and from a bed to chair (including a wheelchair)?: A Little Help needed walking in hospital room?: A Little Help needed climbing 3-5 steps with a railing? : A Lot 6 Click Score: 17    End of Session Equipment Utilized During Treatment: Gait belt Activity Tolerance: Patient tolerated treatment well Patient left: in chair;with call bell/phone within reach Nurse Communication: Mobility status PT Visit Diagnosis: Unsteadiness on feet (R26.81);Other abnormalities of gait and mobility (R26.89);Muscle weakness (generalized) (M62.81);Other symptoms and signs involving the nervous system (R29.898)     Time: 1110-1130 PT Time Calculation (min) (ACUTE ONLY): 20 min  Charges:  $Gait Training: 8-22 mins                     Alben Deeds, PT DPT  Board Certified Neurologic Specialist [place] Pager [phone] Office [phone]    Duncan Dull 01/05/2018, 3:23 PM

## 2018-01-06 DIAGNOSIS — I671 Cerebral aneurysm, nonruptured: Secondary | ICD-10-CM | POA: Diagnosis not present

## 2018-01-06 DIAGNOSIS — Z902 Acquired absence of lung [part of]: Secondary | ICD-10-CM | POA: Diagnosis not present

## 2018-01-06 DIAGNOSIS — Z8673 Personal history of transient ischemic attack (TIA), and cerebral infarction without residual deficits: Secondary | ICD-10-CM | POA: Diagnosis not present

## 2018-01-06 DIAGNOSIS — G629 Polyneuropathy, unspecified: Secondary | ICD-10-CM | POA: Diagnosis not present

## 2018-01-06 DIAGNOSIS — J439 Emphysema, unspecified: Secondary | ICD-10-CM | POA: Diagnosis not present

## 2018-01-06 DIAGNOSIS — Z85118 Personal history of other malignant neoplasm of bronchus and lung: Secondary | ICD-10-CM | POA: Diagnosis not present

## 2018-01-06 DIAGNOSIS — Z7951 Long term (current) use of inhaled steroids: Secondary | ICD-10-CM | POA: Diagnosis not present

## 2018-01-06 DIAGNOSIS — I482 Chronic atrial fibrillation: Secondary | ICD-10-CM | POA: Diagnosis not present

## 2018-01-06 DIAGNOSIS — J479 Bronchiectasis, uncomplicated: Secondary | ICD-10-CM | POA: Diagnosis not present

## 2018-01-06 DIAGNOSIS — Z7902 Long term (current) use of antithrombotics/antiplatelets: Secondary | ICD-10-CM | POA: Diagnosis not present

## 2018-01-08 DIAGNOSIS — Z9181 History of falling: Secondary | ICD-10-CM | POA: Diagnosis not present

## 2018-01-08 DIAGNOSIS — M6281 Muscle weakness (generalized): Secondary | ICD-10-CM | POA: Diagnosis not present

## 2018-01-08 DIAGNOSIS — R634 Abnormal weight loss: Secondary | ICD-10-CM | POA: Diagnosis not present

## 2018-01-08 DIAGNOSIS — I482 Chronic atrial fibrillation: Secondary | ICD-10-CM | POA: Diagnosis not present

## 2018-01-08 DIAGNOSIS — G629 Polyneuropathy, unspecified: Secondary | ICD-10-CM | POA: Diagnosis not present

## 2018-01-08 DIAGNOSIS — R269 Unspecified abnormalities of gait and mobility: Secondary | ICD-10-CM | POA: Diagnosis not present

## 2018-01-08 DIAGNOSIS — I671 Cerebral aneurysm, nonruptured: Secondary | ICD-10-CM | POA: Diagnosis not present

## 2018-01-08 DIAGNOSIS — J449 Chronic obstructive pulmonary disease, unspecified: Secondary | ICD-10-CM | POA: Diagnosis not present

## 2018-01-08 DIAGNOSIS — R5381 Other malaise: Secondary | ICD-10-CM | POA: Diagnosis not present

## 2018-01-09 DIAGNOSIS — I482 Chronic atrial fibrillation: Secondary | ICD-10-CM | POA: Diagnosis not present

## 2018-01-09 DIAGNOSIS — Z8673 Personal history of transient ischemic attack (TIA), and cerebral infarction without residual deficits: Secondary | ICD-10-CM | POA: Diagnosis not present

## 2018-01-09 DIAGNOSIS — G629 Polyneuropathy, unspecified: Secondary | ICD-10-CM | POA: Diagnosis not present

## 2018-01-09 DIAGNOSIS — J439 Emphysema, unspecified: Secondary | ICD-10-CM | POA: Diagnosis not present

## 2018-01-09 DIAGNOSIS — I671 Cerebral aneurysm, nonruptured: Secondary | ICD-10-CM | POA: Diagnosis not present

## 2018-01-09 DIAGNOSIS — J479 Bronchiectasis, uncomplicated: Secondary | ICD-10-CM | POA: Diagnosis not present

## 2018-01-11 ENCOUNTER — Telehealth (HOSPITAL_COMMUNITY): Payer: Self-pay

## 2018-01-11 NOTE — Telephone Encounter (Signed)
Pt's son called back in reference to consult. Pt is moving to [place], [place] today and they are going to find a doctor there since it is so far away. AW

## 2018-01-11 NOTE — Telephone Encounter (Signed)
Called to schedule consult, no answer, left vm. AW  

## 2018-01-24 DIAGNOSIS — I361 Nonrheumatic tricuspid (valve) insufficiency: Secondary | ICD-10-CM | POA: Diagnosis not present

## 2018-01-24 DIAGNOSIS — Z7901 Long term (current) use of anticoagulants: Secondary | ICD-10-CM | POA: Diagnosis not present

## 2018-01-24 DIAGNOSIS — G8929 Other chronic pain: Secondary | ICD-10-CM | POA: Diagnosis present

## 2018-01-24 DIAGNOSIS — I4891 Unspecified atrial fibrillation: Secondary | ICD-10-CM | POA: Diagnosis not present

## 2018-01-24 DIAGNOSIS — K5900 Constipation, unspecified: Secondary | ICD-10-CM | POA: Diagnosis not present

## 2018-01-24 DIAGNOSIS — Z66 Do not resuscitate: Secondary | ICD-10-CM | POA: Diagnosis present

## 2018-01-24 DIAGNOSIS — G629 Polyneuropathy, unspecified: Secondary | ICD-10-CM | POA: Diagnosis present

## 2018-01-24 DIAGNOSIS — R0902 Hypoxemia: Secondary | ICD-10-CM | POA: Diagnosis not present

## 2018-01-24 DIAGNOSIS — I340 Nonrheumatic mitral (valve) insufficiency: Secondary | ICD-10-CM | POA: Diagnosis not present

## 2018-01-24 DIAGNOSIS — E785 Hyperlipidemia, unspecified: Secondary | ICD-10-CM | POA: Diagnosis present

## 2018-01-24 DIAGNOSIS — R002 Palpitations: Secondary | ICD-10-CM | POA: Diagnosis not present

## 2018-01-24 DIAGNOSIS — J159 Unspecified bacterial pneumonia: Secondary | ICD-10-CM | POA: Diagnosis not present

## 2018-01-24 DIAGNOSIS — H3530 Unspecified macular degeneration: Secondary | ICD-10-CM | POA: Diagnosis present

## 2018-01-24 DIAGNOSIS — R9389 Abnormal findings on diagnostic imaging of other specified body structures: Secondary | ICD-10-CM | POA: Diagnosis not present

## 2018-01-24 DIAGNOSIS — J441 Chronic obstructive pulmonary disease with (acute) exacerbation: Secondary | ICD-10-CM | POA: Diagnosis not present

## 2018-01-24 DIAGNOSIS — J90 Pleural effusion, not elsewhere classified: Secondary | ICD-10-CM | POA: Diagnosis not present

## 2018-01-24 DIAGNOSIS — D649 Anemia, unspecified: Secondary | ICD-10-CM | POA: Diagnosis not present

## 2018-01-24 DIAGNOSIS — Z87891 Personal history of nicotine dependence: Secondary | ICD-10-CM | POA: Diagnosis not present

## 2018-01-24 DIAGNOSIS — Z7902 Long term (current) use of antithrombotics/antiplatelets: Secondary | ICD-10-CM | POA: Diagnosis not present

## 2018-01-24 DIAGNOSIS — Z902 Acquired absence of lung [part of]: Secondary | ICD-10-CM | POA: Diagnosis not present

## 2018-01-24 DIAGNOSIS — H548 Legal blindness, as defined in USA: Secondary | ICD-10-CM | POA: Diagnosis present

## 2018-01-24 DIAGNOSIS — R918 Other nonspecific abnormal finding of lung field: Secondary | ICD-10-CM | POA: Diagnosis not present

## 2018-01-24 DIAGNOSIS — J189 Pneumonia, unspecified organism: Secondary | ICD-10-CM | POA: Diagnosis present

## 2018-01-24 DIAGNOSIS — G933 Postviral fatigue syndrome: Secondary | ICD-10-CM | POA: Diagnosis not present

## 2018-01-24 DIAGNOSIS — J439 Emphysema, unspecified: Secondary | ICD-10-CM | POA: Diagnosis present

## 2018-01-24 DIAGNOSIS — J9601 Acute respiratory failure with hypoxia: Secondary | ICD-10-CM | POA: Diagnosis not present

## 2018-01-24 DIAGNOSIS — Z8673 Personal history of transient ischemic attack (TIA), and cerebral infarction without residual deficits: Secondary | ICD-10-CM | POA: Diagnosis not present

## 2018-01-24 DIAGNOSIS — Z85118 Personal history of other malignant neoplasm of bronchus and lung: Secondary | ICD-10-CM | POA: Diagnosis not present

## 2018-01-24 DIAGNOSIS — R590 Localized enlarged lymph nodes: Secondary | ICD-10-CM | POA: Diagnosis present

## 2018-01-24 DIAGNOSIS — R0602 Shortness of breath: Secondary | ICD-10-CM | POA: Diagnosis not present

## 2018-01-24 DIAGNOSIS — J811 Chronic pulmonary edema: Secondary | ICD-10-CM | POA: Diagnosis not present

## 2018-01-24 DIAGNOSIS — E872 Acidosis: Secondary | ICD-10-CM | POA: Diagnosis not present

## 2018-01-29 DIAGNOSIS — R5381 Other malaise: Secondary | ICD-10-CM | POA: Diagnosis not present

## 2018-01-29 DIAGNOSIS — Z7189 Other specified counseling: Secondary | ICD-10-CM | POA: Diagnosis not present

## 2018-01-29 DIAGNOSIS — I4891 Unspecified atrial fibrillation: Secondary | ICD-10-CM | POA: Diagnosis not present

## 2018-01-29 DIAGNOSIS — J181 Lobar pneumonia, unspecified organism: Secondary | ICD-10-CM | POA: Diagnosis not present

## 2018-02-01 DIAGNOSIS — J181 Lobar pneumonia, unspecified organism: Secondary | ICD-10-CM | POA: Diagnosis not present

## 2018-02-01 DIAGNOSIS — R5381 Other malaise: Secondary | ICD-10-CM | POA: Diagnosis not present

## 2018-02-01 DIAGNOSIS — I4891 Unspecified atrial fibrillation: Secondary | ICD-10-CM | POA: Diagnosis not present

## 2018-02-01 DIAGNOSIS — Z8673 Personal history of transient ischemic attack (TIA), and cerebral infarction without residual deficits: Secondary | ICD-10-CM | POA: Diagnosis not present

## 2018-02-03 DIAGNOSIS — I4891 Unspecified atrial fibrillation: Secondary | ICD-10-CM | POA: Diagnosis not present

## 2018-02-03 DIAGNOSIS — M792 Neuralgia and neuritis, unspecified: Secondary | ICD-10-CM | POA: Diagnosis not present

## 2018-02-03 DIAGNOSIS — Z902 Acquired absence of lung [part of]: Secondary | ICD-10-CM | POA: Diagnosis not present

## 2018-02-03 DIAGNOSIS — R5381 Other malaise: Secondary | ICD-10-CM | POA: Diagnosis not present

## 2018-02-04 DIAGNOSIS — I4891 Unspecified atrial fibrillation: Secondary | ICD-10-CM | POA: Diagnosis not present

## 2018-02-04 DIAGNOSIS — Z6824 Body mass index (BMI) 24.0-24.9, adult: Secondary | ICD-10-CM | POA: Diagnosis not present

## 2018-02-04 DIAGNOSIS — Z902 Acquired absence of lung [part of]: Secondary | ICD-10-CM | POA: Diagnosis not present

## 2018-02-04 DIAGNOSIS — J181 Lobar pneumonia, unspecified organism: Secondary | ICD-10-CM | POA: Diagnosis not present

## 2018-02-04 DIAGNOSIS — Z8673 Personal history of transient ischemic attack (TIA), and cerebral infarction without residual deficits: Secondary | ICD-10-CM | POA: Diagnosis not present

## 2018-02-04 DIAGNOSIS — H3530 Unspecified macular degeneration: Secondary | ICD-10-CM | POA: Diagnosis not present

## 2018-02-04 DIAGNOSIS — Z23 Encounter for immunization: Secondary | ICD-10-CM | POA: Diagnosis not present

## 2018-02-04 DIAGNOSIS — G609 Hereditary and idiopathic neuropathy, unspecified: Secondary | ICD-10-CM | POA: Diagnosis not present

## 2018-02-04 DIAGNOSIS — J449 Chronic obstructive pulmonary disease, unspecified: Secondary | ICD-10-CM | POA: Diagnosis not present

## 2018-02-08 DIAGNOSIS — H3530 Unspecified macular degeneration: Secondary | ICD-10-CM | POA: Diagnosis not present

## 2018-02-08 DIAGNOSIS — I4891 Unspecified atrial fibrillation: Secondary | ICD-10-CM | POA: Diagnosis not present

## 2018-02-08 DIAGNOSIS — R5381 Other malaise: Secondary | ICD-10-CM | POA: Diagnosis not present

## 2018-02-08 DIAGNOSIS — M792 Neuralgia and neuritis, unspecified: Secondary | ICD-10-CM | POA: Diagnosis not present

## 2018-02-09 DIAGNOSIS — Z902 Acquired absence of lung [part of]: Secondary | ICD-10-CM | POA: Diagnosis not present

## 2018-02-09 DIAGNOSIS — R918 Other nonspecific abnormal finding of lung field: Secondary | ICD-10-CM | POA: Diagnosis not present

## 2018-02-09 DIAGNOSIS — I272 Pulmonary hypertension, unspecified: Secondary | ICD-10-CM | POA: Diagnosis present

## 2018-02-09 DIAGNOSIS — J918 Pleural effusion in other conditions classified elsewhere: Secondary | ICD-10-CM | POA: Diagnosis not present

## 2018-02-09 DIAGNOSIS — Z79899 Other long term (current) drug therapy: Secondary | ICD-10-CM | POA: Diagnosis not present

## 2018-02-09 DIAGNOSIS — Z883 Allergy status to other anti-infective agents status: Secondary | ICD-10-CM | POA: Diagnosis not present

## 2018-02-09 DIAGNOSIS — Z87442 Personal history of urinary calculi: Secondary | ICD-10-CM | POA: Diagnosis not present

## 2018-02-09 DIAGNOSIS — J939 Pneumothorax, unspecified: Secondary | ICD-10-CM | POA: Diagnosis not present

## 2018-02-09 DIAGNOSIS — J810 Acute pulmonary edema: Secondary | ICD-10-CM | POA: Diagnosis not present

## 2018-02-09 DIAGNOSIS — R0600 Dyspnea, unspecified: Secondary | ICD-10-CM | POA: Diagnosis not present

## 2018-02-09 DIAGNOSIS — J9601 Acute respiratory failure with hypoxia: Secondary | ICD-10-CM | POA: Diagnosis not present

## 2018-02-09 DIAGNOSIS — I480 Paroxysmal atrial fibrillation: Secondary | ICD-10-CM | POA: Diagnosis not present

## 2018-02-09 DIAGNOSIS — J188 Other pneumonia, unspecified organism: Secondary | ICD-10-CM | POA: Diagnosis not present

## 2018-02-09 DIAGNOSIS — I482 Chronic atrial fibrillation, unspecified: Secondary | ICD-10-CM | POA: Diagnosis present

## 2018-02-09 DIAGNOSIS — R9431 Abnormal electrocardiogram [ECG] [EKG]: Secondary | ICD-10-CM | POA: Diagnosis not present

## 2018-02-09 DIAGNOSIS — R0602 Shortness of breath: Secondary | ICD-10-CM | POA: Diagnosis not present

## 2018-02-09 DIAGNOSIS — Z4682 Encounter for fitting and adjustment of non-vascular catheter: Secondary | ICD-10-CM | POA: Diagnosis not present

## 2018-02-09 DIAGNOSIS — J189 Pneumonia, unspecified organism: Secondary | ICD-10-CM | POA: Diagnosis not present

## 2018-02-09 DIAGNOSIS — J156 Pneumonia due to other aerobic Gram-negative bacteria: Secondary | ICD-10-CM | POA: Diagnosis present

## 2018-02-09 DIAGNOSIS — Z7901 Long term (current) use of anticoagulants: Secondary | ICD-10-CM | POA: Diagnosis not present

## 2018-02-09 DIAGNOSIS — I517 Cardiomegaly: Secondary | ICD-10-CM | POA: Diagnosis not present

## 2018-02-09 DIAGNOSIS — Z7409 Other reduced mobility: Secondary | ICD-10-CM | POA: Diagnosis not present

## 2018-02-09 DIAGNOSIS — Z85118 Personal history of other malignant neoplasm of bronchus and lung: Secondary | ICD-10-CM | POA: Diagnosis not present

## 2018-02-09 DIAGNOSIS — E875 Hyperkalemia: Secondary | ICD-10-CM | POA: Diagnosis present

## 2018-02-09 DIAGNOSIS — I77810 Thoracic aortic ectasia: Secondary | ICD-10-CM | POA: Diagnosis not present

## 2018-02-09 DIAGNOSIS — I5032 Chronic diastolic (congestive) heart failure: Secondary | ICD-10-CM | POA: Diagnosis present

## 2018-02-09 DIAGNOSIS — Z7902 Long term (current) use of antithrombotics/antiplatelets: Secondary | ICD-10-CM | POA: Diagnosis not present

## 2018-02-09 DIAGNOSIS — I509 Heart failure, unspecified: Secondary | ICD-10-CM | POA: Diagnosis not present

## 2018-02-09 DIAGNOSIS — Z87891 Personal history of nicotine dependence: Secondary | ICD-10-CM | POA: Diagnosis not present

## 2018-02-09 DIAGNOSIS — J439 Emphysema, unspecified: Secondary | ICD-10-CM | POA: Diagnosis present

## 2018-02-09 DIAGNOSIS — G933 Postviral fatigue syndrome: Secondary | ICD-10-CM | POA: Diagnosis not present

## 2018-02-09 DIAGNOSIS — J90 Pleural effusion, not elsewhere classified: Secondary | ICD-10-CM | POA: Diagnosis present

## 2018-02-09 DIAGNOSIS — Z66 Do not resuscitate: Secondary | ICD-10-CM | POA: Diagnosis not present

## 2018-02-09 DIAGNOSIS — D649 Anemia, unspecified: Secondary | ICD-10-CM | POA: Diagnosis present

## 2018-02-09 DIAGNOSIS — Z8673 Personal history of transient ischemic attack (TIA), and cerebral infarction without residual deficits: Secondary | ICD-10-CM | POA: Diagnosis not present

## 2018-02-17 DIAGNOSIS — R600 Localized edema: Secondary | ICD-10-CM | POA: Diagnosis not present

## 2018-02-17 DIAGNOSIS — J189 Pneumonia, unspecified organism: Secondary | ICD-10-CM | POA: Diagnosis not present

## 2018-02-17 DIAGNOSIS — J918 Pleural effusion in other conditions classified elsewhere: Secondary | ICD-10-CM | POA: Diagnosis not present

## 2018-02-17 DIAGNOSIS — I482 Chronic atrial fibrillation, unspecified: Secondary | ICD-10-CM | POA: Diagnosis not present

## 2018-02-17 DIAGNOSIS — J181 Lobar pneumonia, unspecified organism: Secondary | ICD-10-CM | POA: Diagnosis not present

## 2018-02-17 DIAGNOSIS — J449 Chronic obstructive pulmonary disease, unspecified: Secondary | ICD-10-CM | POA: Diagnosis not present

## 2018-02-17 DIAGNOSIS — I4891 Unspecified atrial fibrillation: Secondary | ICD-10-CM | POA: Diagnosis not present

## 2018-02-17 DIAGNOSIS — R0602 Shortness of breath: Secondary | ICD-10-CM | POA: Diagnosis not present

## 2018-02-18 DIAGNOSIS — J181 Lobar pneumonia, unspecified organism: Secondary | ICD-10-CM | POA: Diagnosis not present

## 2018-02-18 DIAGNOSIS — R5381 Other malaise: Secondary | ICD-10-CM | POA: Diagnosis not present

## 2018-02-18 DIAGNOSIS — I4891 Unspecified atrial fibrillation: Secondary | ICD-10-CM | POA: Diagnosis not present

## 2018-02-18 DIAGNOSIS — Z7189 Other specified counseling: Secondary | ICD-10-CM | POA: Diagnosis not present

## 2018-02-19 DIAGNOSIS — J189 Pneumonia, unspecified organism: Secondary | ICD-10-CM | POA: Diagnosis not present

## 2018-02-19 DIAGNOSIS — H109 Unspecified conjunctivitis: Secondary | ICD-10-CM | POA: Diagnosis not present

## 2018-02-19 DIAGNOSIS — R5381 Other malaise: Secondary | ICD-10-CM | POA: Diagnosis not present

## 2018-02-19 DIAGNOSIS — J918 Pleural effusion in other conditions classified elsewhere: Secondary | ICD-10-CM | POA: Diagnosis not present

## 2018-02-24 DIAGNOSIS — J189 Pneumonia, unspecified organism: Secondary | ICD-10-CM | POA: Diagnosis not present

## 2018-02-24 DIAGNOSIS — M792 Neuralgia and neuritis, unspecified: Secondary | ICD-10-CM | POA: Diagnosis not present

## 2018-02-24 DIAGNOSIS — Z902 Acquired absence of lung [part of]: Secondary | ICD-10-CM | POA: Diagnosis not present

## 2018-02-24 DIAGNOSIS — J918 Pleural effusion in other conditions classified elsewhere: Secondary | ICD-10-CM | POA: Diagnosis not present

## 2018-03-01 DIAGNOSIS — J439 Emphysema, unspecified: Secondary | ICD-10-CM | POA: Diagnosis not present

## 2018-03-01 DIAGNOSIS — I482 Chronic atrial fibrillation, unspecified: Secondary | ICD-10-CM | POA: Diagnosis not present

## 2018-03-01 DIAGNOSIS — I119 Hypertensive heart disease without heart failure: Secondary | ICD-10-CM | POA: Diagnosis not present

## 2018-03-01 DIAGNOSIS — G9009 Other idiopathic peripheral autonomic neuropathy: Secondary | ICD-10-CM | POA: Diagnosis not present

## 2018-03-08 DIAGNOSIS — I700 Atherosclerosis of aorta: Secondary | ICD-10-CM | POA: Diagnosis not present

## 2018-03-08 DIAGNOSIS — N200 Calculus of kidney: Secondary | ICD-10-CM | POA: Diagnosis not present

## 2018-03-08 DIAGNOSIS — Z87442 Personal history of urinary calculi: Secondary | ICD-10-CM | POA: Diagnosis not present

## 2018-03-17 DIAGNOSIS — H401124 Primary open-angle glaucoma, left eye, indeterminate stage: Secondary | ICD-10-CM | POA: Diagnosis not present

## 2018-03-17 DIAGNOSIS — H401130 Primary open-angle glaucoma, bilateral, stage unspecified: Secondary | ICD-10-CM | POA: Diagnosis not present

## 2018-03-17 DIAGNOSIS — Z961 Presence of intraocular lens: Secondary | ICD-10-CM | POA: Diagnosis not present

## 2018-03-23 DIAGNOSIS — J811 Chronic pulmonary edema: Secondary | ICD-10-CM | POA: Diagnosis not present

## 2018-03-23 DIAGNOSIS — Z66 Do not resuscitate: Secondary | ICD-10-CM | POA: Diagnosis not present

## 2018-03-23 DIAGNOSIS — I272 Pulmonary hypertension, unspecified: Secondary | ICD-10-CM | POA: Diagnosis not present

## 2018-03-23 DIAGNOSIS — Z85118 Personal history of other malignant neoplasm of bronchus and lung: Secondary | ICD-10-CM | POA: Diagnosis not present

## 2018-03-23 DIAGNOSIS — J9601 Acute respiratory failure with hypoxia: Secondary | ICD-10-CM | POA: Diagnosis present

## 2018-03-23 DIAGNOSIS — R531 Weakness: Secondary | ICD-10-CM | POA: Diagnosis not present

## 2018-03-23 DIAGNOSIS — R0602 Shortness of breath: Secondary | ICD-10-CM | POA: Diagnosis not present

## 2018-03-23 DIAGNOSIS — Z8701 Personal history of pneumonia (recurrent): Secondary | ICD-10-CM | POA: Diagnosis not present

## 2018-03-23 DIAGNOSIS — R0609 Other forms of dyspnea: Secondary | ICD-10-CM | POA: Diagnosis not present

## 2018-03-23 DIAGNOSIS — I4891 Unspecified atrial fibrillation: Secondary | ICD-10-CM | POA: Diagnosis present

## 2018-03-23 DIAGNOSIS — J9811 Atelectasis: Secondary | ICD-10-CM | POA: Diagnosis not present

## 2018-03-23 DIAGNOSIS — I517 Cardiomegaly: Secondary | ICD-10-CM | POA: Diagnosis not present

## 2018-03-23 DIAGNOSIS — J189 Pneumonia, unspecified organism: Secondary | ICD-10-CM | POA: Diagnosis not present

## 2018-03-23 DIAGNOSIS — I482 Chronic atrial fibrillation, unspecified: Secondary | ICD-10-CM | POA: Diagnosis not present

## 2018-03-23 DIAGNOSIS — Z7901 Long term (current) use of anticoagulants: Secondary | ICD-10-CM | POA: Diagnosis not present

## 2018-03-23 DIAGNOSIS — Z8673 Personal history of transient ischemic attack (TIA), and cerebral infarction without residual deficits: Secondary | ICD-10-CM | POA: Diagnosis not present

## 2018-03-23 DIAGNOSIS — J810 Acute pulmonary edema: Secondary | ICD-10-CM | POA: Diagnosis not present

## 2018-03-23 DIAGNOSIS — R918 Other nonspecific abnormal finding of lung field: Secondary | ICD-10-CM | POA: Diagnosis not present

## 2018-03-23 DIAGNOSIS — I480 Paroxysmal atrial fibrillation: Secondary | ICD-10-CM | POA: Diagnosis present

## 2018-03-23 DIAGNOSIS — J918 Pleural effusion in other conditions classified elsewhere: Secondary | ICD-10-CM | POA: Diagnosis not present

## 2018-03-23 DIAGNOSIS — D649 Anemia, unspecified: Secondary | ICD-10-CM | POA: Diagnosis present

## 2018-03-23 DIAGNOSIS — J90 Pleural effusion, not elsewhere classified: Secondary | ICD-10-CM | POA: Diagnosis not present

## 2018-03-23 DIAGNOSIS — Z7951 Long term (current) use of inhaled steroids: Secondary | ICD-10-CM | POA: Diagnosis not present

## 2018-04-05 DIAGNOSIS — E876 Hypokalemia: Secondary | ICD-10-CM | POA: Diagnosis not present

## 2018-04-05 DIAGNOSIS — J449 Chronic obstructive pulmonary disease, unspecified: Secondary | ICD-10-CM | POA: Diagnosis not present

## 2018-04-05 DIAGNOSIS — I4891 Unspecified atrial fibrillation: Secondary | ICD-10-CM | POA: Diagnosis not present

## 2018-05-04 DIAGNOSIS — J9611 Chronic respiratory failure with hypoxia: Secondary | ICD-10-CM | POA: Diagnosis not present

## 2018-05-04 DIAGNOSIS — I5020 Unspecified systolic (congestive) heart failure: Secondary | ICD-10-CM | POA: Diagnosis not present

## 2018-05-04 DIAGNOSIS — R05 Cough: Secondary | ICD-10-CM | POA: Diagnosis not present

## 2018-05-04 DIAGNOSIS — R0602 Shortness of breath: Secondary | ICD-10-CM | POA: Diagnosis not present

## 2018-05-04 DIAGNOSIS — Z6822 Body mass index (BMI) 22.0-22.9, adult: Secondary | ICD-10-CM | POA: Diagnosis not present

## 2018-05-04 DIAGNOSIS — J439 Emphysema, unspecified: Secondary | ICD-10-CM | POA: Diagnosis not present

## 2018-05-06 DIAGNOSIS — Z6822 Body mass index (BMI) 22.0-22.9, adult: Secondary | ICD-10-CM | POA: Diagnosis not present

## 2018-05-06 DIAGNOSIS — J069 Acute upper respiratory infection, unspecified: Secondary | ICD-10-CM | POA: Diagnosis not present

## 2018-05-14 DIAGNOSIS — D72829 Elevated white blood cell count, unspecified: Secondary | ICD-10-CM | POA: Diagnosis not present

## 2018-05-17 DIAGNOSIS — J9601 Acute respiratory failure with hypoxia: Secondary | ICD-10-CM | POA: Diagnosis not present

## 2018-05-17 DIAGNOSIS — Z79899 Other long term (current) drug therapy: Secondary | ICD-10-CM | POA: Diagnosis not present

## 2018-05-17 DIAGNOSIS — J9621 Acute and chronic respiratory failure with hypoxia: Secondary | ICD-10-CM | POA: Diagnosis present

## 2018-05-17 DIAGNOSIS — J441 Chronic obstructive pulmonary disease with (acute) exacerbation: Secondary | ICD-10-CM | POA: Diagnosis not present

## 2018-05-17 DIAGNOSIS — I482 Chronic atrial fibrillation, unspecified: Secondary | ICD-10-CM | POA: Diagnosis present

## 2018-05-17 DIAGNOSIS — Z87891 Personal history of nicotine dependence: Secondary | ICD-10-CM | POA: Diagnosis not present

## 2018-05-17 DIAGNOSIS — Z902 Acquired absence of lung [part of]: Secondary | ICD-10-CM | POA: Diagnosis not present

## 2018-05-17 DIAGNOSIS — F419 Anxiety disorder, unspecified: Secondary | ICD-10-CM | POA: Diagnosis present

## 2018-05-17 DIAGNOSIS — Z85118 Personal history of other malignant neoplasm of bronchus and lung: Secondary | ICD-10-CM | POA: Diagnosis not present

## 2018-05-17 DIAGNOSIS — J189 Pneumonia, unspecified organism: Secondary | ICD-10-CM | POA: Diagnosis present

## 2018-05-17 DIAGNOSIS — Z8673 Personal history of transient ischemic attack (TIA), and cerebral infarction without residual deficits: Secondary | ICD-10-CM | POA: Diagnosis not present

## 2018-05-17 DIAGNOSIS — I5033 Acute on chronic diastolic (congestive) heart failure: Secondary | ICD-10-CM | POA: Diagnosis present

## 2018-05-17 DIAGNOSIS — Z9981 Dependence on supplemental oxygen: Secondary | ICD-10-CM | POA: Diagnosis not present

## 2018-05-17 DIAGNOSIS — J9611 Chronic respiratory failure with hypoxia: Secondary | ICD-10-CM | POA: Diagnosis not present

## 2018-05-17 DIAGNOSIS — R0689 Other abnormalities of breathing: Secondary | ICD-10-CM | POA: Diagnosis not present

## 2018-05-17 DIAGNOSIS — R0602 Shortness of breath: Secondary | ICD-10-CM | POA: Diagnosis not present

## 2018-05-17 DIAGNOSIS — J90 Pleural effusion, not elsewhere classified: Secondary | ICD-10-CM | POA: Diagnosis not present

## 2018-05-17 DIAGNOSIS — J440 Chronic obstructive pulmonary disease with acute lower respiratory infection: Secondary | ICD-10-CM | POA: Diagnosis not present

## 2018-05-17 DIAGNOSIS — Z66 Do not resuscitate: Secondary | ICD-10-CM | POA: Diagnosis present

## 2018-05-26 DIAGNOSIS — K6389 Other specified diseases of intestine: Secondary | ICD-10-CM | POA: Diagnosis not present

## 2018-05-26 DIAGNOSIS — A09 Infectious gastroenteritis and colitis, unspecified: Secondary | ICD-10-CM | POA: Diagnosis not present

## 2018-05-26 DIAGNOSIS — Z7951 Long term (current) use of inhaled steroids: Secondary | ICD-10-CM | POA: Diagnosis not present

## 2018-05-26 DIAGNOSIS — J09X3 Influenza due to identified novel influenza A virus with gastrointestinal manifestations: Secondary | ICD-10-CM | POA: Diagnosis not present

## 2018-05-26 DIAGNOSIS — R197 Diarrhea, unspecified: Secondary | ICD-10-CM | POA: Diagnosis not present

## 2018-05-26 DIAGNOSIS — Z9981 Dependence on supplemental oxygen: Secondary | ICD-10-CM | POA: Diagnosis not present

## 2018-05-26 DIAGNOSIS — R112 Nausea with vomiting, unspecified: Secondary | ICD-10-CM | POA: Diagnosis not present

## 2018-05-26 DIAGNOSIS — Z7901 Long term (current) use of anticoagulants: Secondary | ICD-10-CM | POA: Diagnosis not present

## 2018-05-26 DIAGNOSIS — Z902 Acquired absence of lung [part of]: Secondary | ICD-10-CM | POA: Diagnosis not present

## 2018-05-26 DIAGNOSIS — G933 Postviral fatigue syndrome: Secondary | ICD-10-CM | POA: Diagnosis not present

## 2018-05-26 DIAGNOSIS — A4189 Other specified sepsis: Secondary | ICD-10-CM | POA: Diagnosis not present

## 2018-05-26 DIAGNOSIS — I5032 Chronic diastolic (congestive) heart failure: Secondary | ICD-10-CM | POA: Diagnosis present

## 2018-05-26 DIAGNOSIS — E785 Hyperlipidemia, unspecified: Secondary | ICD-10-CM | POA: Diagnosis present

## 2018-05-26 DIAGNOSIS — J90 Pleural effusion, not elsewhere classified: Secondary | ICD-10-CM | POA: Diagnosis not present

## 2018-05-26 DIAGNOSIS — N3000 Acute cystitis without hematuria: Secondary | ICD-10-CM | POA: Diagnosis not present

## 2018-05-26 DIAGNOSIS — M6289 Other specified disorders of muscle: Secondary | ICD-10-CM | POA: Diagnosis not present

## 2018-05-26 DIAGNOSIS — J111 Influenza due to unidentified influenza virus with other respiratory manifestations: Secondary | ICD-10-CM | POA: Diagnosis present

## 2018-05-26 DIAGNOSIS — I482 Chronic atrial fibrillation, unspecified: Secondary | ICD-10-CM | POA: Diagnosis present

## 2018-05-26 DIAGNOSIS — R8271 Bacteriuria: Secondary | ICD-10-CM | POA: Diagnosis present

## 2018-05-26 DIAGNOSIS — R0602 Shortness of breath: Secondary | ICD-10-CM | POA: Diagnosis not present

## 2018-05-26 DIAGNOSIS — Z8673 Personal history of transient ischemic attack (TIA), and cerebral infarction without residual deficits: Secondary | ICD-10-CM | POA: Diagnosis not present

## 2018-05-26 DIAGNOSIS — Z87891 Personal history of nicotine dependence: Secondary | ICD-10-CM | POA: Diagnosis not present

## 2018-05-26 DIAGNOSIS — J9611 Chronic respiratory failure with hypoxia: Secondary | ICD-10-CM | POA: Diagnosis present

## 2018-05-26 DIAGNOSIS — D72829 Elevated white blood cell count, unspecified: Secondary | ICD-10-CM | POA: Diagnosis not present

## 2018-05-26 DIAGNOSIS — J449 Chronic obstructive pulmonary disease, unspecified: Secondary | ICD-10-CM | POA: Diagnosis not present

## 2018-05-26 DIAGNOSIS — R110 Nausea: Secondary | ICD-10-CM | POA: Diagnosis not present

## 2018-05-26 DIAGNOSIS — J101 Influenza due to other identified influenza virus with other respiratory manifestations: Secondary | ICD-10-CM | POA: Diagnosis not present

## 2018-05-26 DIAGNOSIS — Z800 Family history of malignant neoplasm of digestive organs: Secondary | ICD-10-CM | POA: Diagnosis not present

## 2018-05-26 DIAGNOSIS — Z79899 Other long term (current) drug therapy: Secondary | ICD-10-CM | POA: Diagnosis not present

## 2018-05-26 DIAGNOSIS — I509 Heart failure, unspecified: Secondary | ICD-10-CM | POA: Diagnosis not present

## 2018-05-26 DIAGNOSIS — J440 Chronic obstructive pulmonary disease with acute lower respiratory infection: Secondary | ICD-10-CM | POA: Diagnosis not present

## 2018-05-26 DIAGNOSIS — K591 Functional diarrhea: Secondary | ICD-10-CM | POA: Diagnosis not present

## 2018-05-26 DIAGNOSIS — H409 Unspecified glaucoma: Secondary | ICD-10-CM | POA: Diagnosis present

## 2018-05-26 DIAGNOSIS — M25559 Pain in unspecified hip: Secondary | ICD-10-CM | POA: Diagnosis not present

## 2018-05-26 DIAGNOSIS — E872 Acidosis: Secondary | ICD-10-CM | POA: Diagnosis not present

## 2018-05-26 DIAGNOSIS — Z85118 Personal history of other malignant neoplasm of bronchus and lung: Secondary | ICD-10-CM | POA: Diagnosis not present

## 2018-05-26 DIAGNOSIS — I4891 Unspecified atrial fibrillation: Secondary | ICD-10-CM | POA: Diagnosis not present

## 2018-05-26 DIAGNOSIS — B952 Enterococcus as the cause of diseases classified elsewhere: Secondary | ICD-10-CM | POA: Diagnosis present

## 2018-06-02 DIAGNOSIS — R5381 Other malaise: Secondary | ICD-10-CM | POA: Diagnosis not present

## 2018-06-02 DIAGNOSIS — J449 Chronic obstructive pulmonary disease, unspecified: Secondary | ICD-10-CM | POA: Diagnosis not present

## 2018-06-02 DIAGNOSIS — Z7189 Other specified counseling: Secondary | ICD-10-CM | POA: Diagnosis not present

## 2018-06-02 DIAGNOSIS — J101 Influenza due to other identified influenza virus with other respiratory manifestations: Secondary | ICD-10-CM | POA: Diagnosis not present

## 2018-06-04 DIAGNOSIS — G629 Polyneuropathy, unspecified: Secondary | ICD-10-CM | POA: Diagnosis not present

## 2018-06-04 DIAGNOSIS — J101 Influenza due to other identified influenza virus with other respiratory manifestations: Secondary | ICD-10-CM | POA: Diagnosis not present

## 2018-06-04 DIAGNOSIS — J449 Chronic obstructive pulmonary disease, unspecified: Secondary | ICD-10-CM | POA: Diagnosis not present

## 2018-06-04 DIAGNOSIS — R5381 Other malaise: Secondary | ICD-10-CM | POA: Diagnosis not present

## 2018-06-07 DIAGNOSIS — I4891 Unspecified atrial fibrillation: Secondary | ICD-10-CM | POA: Diagnosis not present

## 2018-06-07 DIAGNOSIS — J189 Pneumonia, unspecified organism: Secondary | ICD-10-CM | POA: Diagnosis not present

## 2018-06-07 DIAGNOSIS — J101 Influenza due to other identified influenza virus with other respiratory manifestations: Secondary | ICD-10-CM | POA: Diagnosis not present

## 2018-06-07 DIAGNOSIS — R5381 Other malaise: Secondary | ICD-10-CM | POA: Diagnosis not present

## 2018-06-09 DIAGNOSIS — I4891 Unspecified atrial fibrillation: Secondary | ICD-10-CM | POA: Diagnosis not present

## 2018-06-09 DIAGNOSIS — R5381 Other malaise: Secondary | ICD-10-CM | POA: Diagnosis not present

## 2018-06-09 DIAGNOSIS — J101 Influenza due to other identified influenza virus with other respiratory manifestations: Secondary | ICD-10-CM | POA: Diagnosis not present

## 2018-06-09 DIAGNOSIS — J189 Pneumonia, unspecified organism: Secondary | ICD-10-CM | POA: Diagnosis not present

## 2018-06-15 DIAGNOSIS — I4891 Unspecified atrial fibrillation: Secondary | ICD-10-CM | POA: Diagnosis not present

## 2018-06-15 DIAGNOSIS — J449 Chronic obstructive pulmonary disease, unspecified: Secondary | ICD-10-CM | POA: Diagnosis not present

## 2018-06-15 DIAGNOSIS — M7989 Other specified soft tissue disorders: Secondary | ICD-10-CM | POA: Diagnosis not present

## 2018-06-15 DIAGNOSIS — R5381 Other malaise: Secondary | ICD-10-CM | POA: Diagnosis not present

## 2018-06-15 DIAGNOSIS — G629 Polyneuropathy, unspecified: Secondary | ICD-10-CM | POA: Diagnosis not present

## 2018-06-17 DIAGNOSIS — I4891 Unspecified atrial fibrillation: Secondary | ICD-10-CM | POA: Diagnosis not present

## 2018-06-17 DIAGNOSIS — G629 Polyneuropathy, unspecified: Secondary | ICD-10-CM | POA: Diagnosis not present

## 2018-06-17 DIAGNOSIS — R5381 Other malaise: Secondary | ICD-10-CM | POA: Diagnosis not present

## 2018-06-17 DIAGNOSIS — J449 Chronic obstructive pulmonary disease, unspecified: Secondary | ICD-10-CM | POA: Diagnosis not present

## 2018-06-22 DIAGNOSIS — I4891 Unspecified atrial fibrillation: Secondary | ICD-10-CM | POA: Diagnosis not present

## 2018-06-22 DIAGNOSIS — G629 Polyneuropathy, unspecified: Secondary | ICD-10-CM | POA: Diagnosis not present

## 2018-06-22 DIAGNOSIS — R5381 Other malaise: Secondary | ICD-10-CM | POA: Diagnosis not present

## 2018-06-22 DIAGNOSIS — J449 Chronic obstructive pulmonary disease, unspecified: Secondary | ICD-10-CM | POA: Diagnosis not present

## 2018-06-28 DIAGNOSIS — J449 Chronic obstructive pulmonary disease, unspecified: Secondary | ICD-10-CM | POA: Diagnosis not present

## 2018-06-28 DIAGNOSIS — I4891 Unspecified atrial fibrillation: Secondary | ICD-10-CM | POA: Diagnosis not present

## 2018-06-28 DIAGNOSIS — G629 Polyneuropathy, unspecified: Secondary | ICD-10-CM | POA: Diagnosis not present

## 2018-06-28 DIAGNOSIS — R5381 Other malaise: Secondary | ICD-10-CM | POA: Diagnosis not present

## 2018-06-30 DIAGNOSIS — I4891 Unspecified atrial fibrillation: Secondary | ICD-10-CM | POA: Diagnosis not present

## 2018-06-30 DIAGNOSIS — G629 Polyneuropathy, unspecified: Secondary | ICD-10-CM | POA: Diagnosis not present

## 2018-06-30 DIAGNOSIS — R5381 Other malaise: Secondary | ICD-10-CM | POA: Diagnosis not present

## 2018-06-30 DIAGNOSIS — J449 Chronic obstructive pulmonary disease, unspecified: Secondary | ICD-10-CM | POA: Diagnosis not present

## 2018-07-03 DIAGNOSIS — J439 Emphysema, unspecified: Secondary | ICD-10-CM | POA: Diagnosis not present

## 2018-07-03 DIAGNOSIS — I5033 Acute on chronic diastolic (congestive) heart failure: Secondary | ICD-10-CM | POA: Diagnosis not present

## 2018-07-03 DIAGNOSIS — J9621 Acute and chronic respiratory failure with hypoxia: Secondary | ICD-10-CM | POA: Diagnosis not present

## 2018-07-03 DIAGNOSIS — I10 Essential (primary) hypertension: Secondary | ICD-10-CM | POA: Diagnosis not present

## 2018-07-20 DIAGNOSIS — I509 Heart failure, unspecified: Secondary | ICD-10-CM | POA: Diagnosis not present

## 2018-07-20 DIAGNOSIS — G609 Hereditary and idiopathic neuropathy, unspecified: Secondary | ICD-10-CM | POA: Diagnosis not present

## 2018-07-29 DIAGNOSIS — H401130 Primary open-angle glaucoma, bilateral, stage unspecified: Secondary | ICD-10-CM | POA: Diagnosis not present

## 2018-07-29 DIAGNOSIS — Z961 Presence of intraocular lens: Secondary | ICD-10-CM | POA: Diagnosis not present

## 2018-08-27 DIAGNOSIS — I509 Heart failure, unspecified: Secondary | ICD-10-CM | POA: Diagnosis not present

## 2018-08-27 DIAGNOSIS — J449 Chronic obstructive pulmonary disease, unspecified: Secondary | ICD-10-CM | POA: Diagnosis not present

## 2018-08-27 DIAGNOSIS — G609 Hereditary and idiopathic neuropathy, unspecified: Secondary | ICD-10-CM | POA: Diagnosis not present

## 2018-08-27 DIAGNOSIS — I4891 Unspecified atrial fibrillation: Secondary | ICD-10-CM | POA: Diagnosis not present

## 2018-10-27 DIAGNOSIS — I4891 Unspecified atrial fibrillation: Secondary | ICD-10-CM | POA: Diagnosis not present

## 2018-10-27 DIAGNOSIS — J209 Acute bronchitis, unspecified: Secondary | ICD-10-CM | POA: Diagnosis not present

## 2018-10-27 DIAGNOSIS — I509 Heart failure, unspecified: Secondary | ICD-10-CM | POA: Diagnosis not present

## 2018-10-27 DIAGNOSIS — R600 Localized edema: Secondary | ICD-10-CM | POA: Diagnosis not present

## 2018-11-02 DIAGNOSIS — J439 Emphysema, unspecified: Secondary | ICD-10-CM | POA: Diagnosis not present

## 2018-11-02 DIAGNOSIS — J9611 Chronic respiratory failure with hypoxia: Secondary | ICD-10-CM | POA: Diagnosis not present

## 2018-11-02 DIAGNOSIS — R05 Cough: Secondary | ICD-10-CM | POA: Diagnosis not present

## 2018-11-02 DIAGNOSIS — Z6823 Body mass index (BMI) 23.0-23.9, adult: Secondary | ICD-10-CM | POA: Diagnosis not present

## 2018-11-16 DIAGNOSIS — G629 Polyneuropathy, unspecified: Secondary | ICD-10-CM | POA: Diagnosis not present

## 2018-11-29 DIAGNOSIS — I5020 Unspecified systolic (congestive) heart failure: Secondary | ICD-10-CM | POA: Diagnosis not present

## 2018-11-29 DIAGNOSIS — I4891 Unspecified atrial fibrillation: Secondary | ICD-10-CM | POA: Diagnosis not present

## 2018-11-29 DIAGNOSIS — I10 Essential (primary) hypertension: Secondary | ICD-10-CM | POA: Diagnosis not present

## 2018-11-29 DIAGNOSIS — I509 Heart failure, unspecified: Secondary | ICD-10-CM | POA: Diagnosis not present

## 2018-11-29 DIAGNOSIS — Z6823 Body mass index (BMI) 23.0-23.9, adult: Secondary | ICD-10-CM | POA: Diagnosis not present

## 2019-01-07 DIAGNOSIS — J9611 Chronic respiratory failure with hypoxia: Secondary | ICD-10-CM | POA: Diagnosis not present

## 2019-01-07 DIAGNOSIS — J439 Emphysema, unspecified: Secondary | ICD-10-CM | POA: Diagnosis not present

## 2019-01-07 DIAGNOSIS — I5020 Unspecified systolic (congestive) heart failure: Secondary | ICD-10-CM | POA: Diagnosis not present

## 2019-01-07 DIAGNOSIS — Z6823 Body mass index (BMI) 23.0-23.9, adult: Secondary | ICD-10-CM | POA: Diagnosis not present

## 2019-01-21 ENCOUNTER — Other Ambulatory Visit: Payer: Self-pay

## 2019-03-02 DIAGNOSIS — R600 Localized edema: Secondary | ICD-10-CM | POA: Diagnosis not present

## 2019-03-02 DIAGNOSIS — Z8673 Personal history of transient ischemic attack (TIA), and cerebral infarction without residual deficits: Secondary | ICD-10-CM | POA: Diagnosis not present

## 2019-03-02 DIAGNOSIS — I482 Chronic atrial fibrillation, unspecified: Secondary | ICD-10-CM | POA: Diagnosis not present

## 2019-03-02 DIAGNOSIS — Z7901 Long term (current) use of anticoagulants: Secondary | ICD-10-CM | POA: Diagnosis not present

## 2019-03-03 DIAGNOSIS — I509 Heart failure, unspecified: Secondary | ICD-10-CM | POA: Diagnosis not present

## 2019-03-03 DIAGNOSIS — Z23 Encounter for immunization: Secondary | ICD-10-CM | POA: Diagnosis not present

## 2019-03-03 DIAGNOSIS — J449 Chronic obstructive pulmonary disease, unspecified: Secondary | ICD-10-CM | POA: Diagnosis not present

## 2019-03-03 DIAGNOSIS — Z6824 Body mass index (BMI) 24.0-24.9, adult: Secondary | ICD-10-CM | POA: Diagnosis not present

## 2019-03-03 DIAGNOSIS — I4891 Unspecified atrial fibrillation: Secondary | ICD-10-CM | POA: Diagnosis not present

## 2020-06-18 IMAGING — MR MR HEAD W/O CM
10 of 11 series · 42 of 48 positions shown · non-contrast
Comparison: Head CT/CTA/CTP 01/03/2018

CLINICAL DATA: Aphasia

EXAM:
MRI HEAD WITHOUT CONTRAST
TECHNIQUE: Multiplanar, multiecho pulse sequences of the brain and surrounding
structures were obtained without intravenous contrast.

[Series 5: DWI · axial · 4.0mm · 0.88mm/px · z∈[-62,+82]mm · 7 of 76 slices shown (1 of 4)]
[im 1/76]
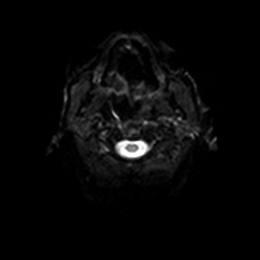
[im 13/76]
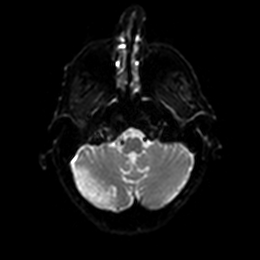
[im 26/76]
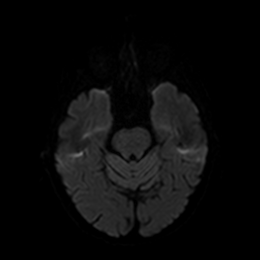
[im 38/76]
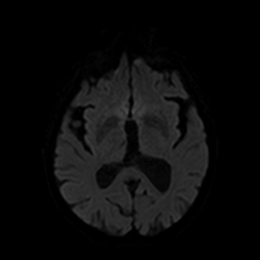
[im 51/76]
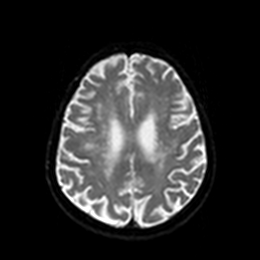
[im 63/76]
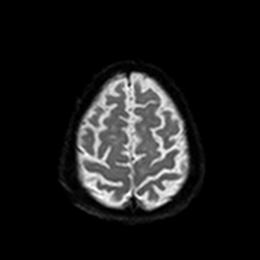
[im 76/76]
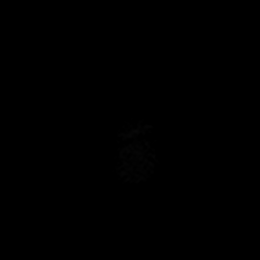

[Series 6: DWI · axial · 4.0mm · 0.88mm/px · z∈[-62,+82]mm · 4 of 38 slices shown (2 of 4)]
[im 1/38]
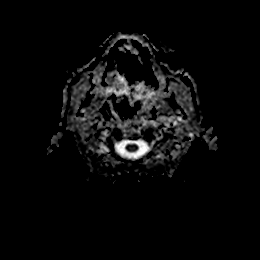
[im 13/38]
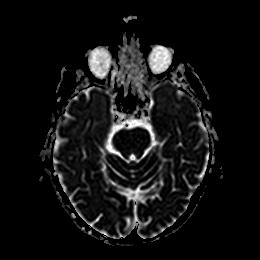
[im 25/38]
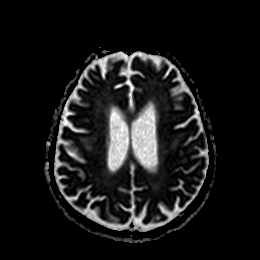
[im 38/38]
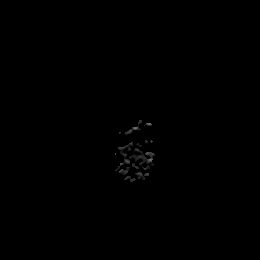

[Series 7: DWI · coronal · 4.0mm · 0.88mm/px · 7 of 72 slices shown (3 of 4)]
[im 1/72]
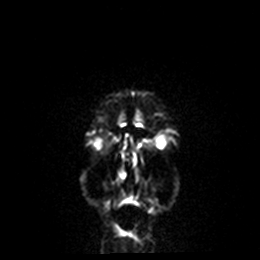
[im 12/72]
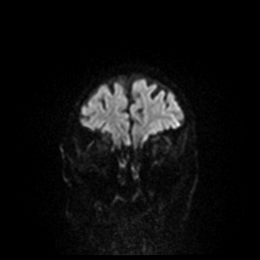
[im 24/72]
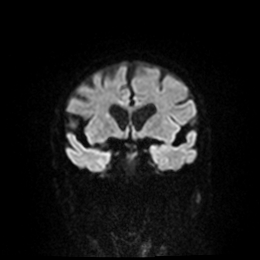
[im 36/72]
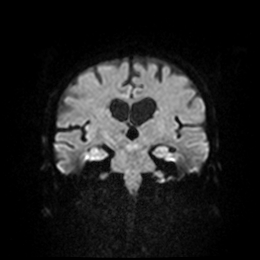
[im 48/72]
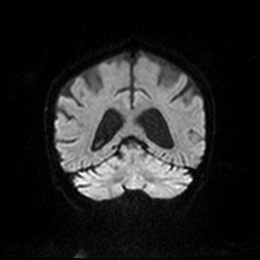
[im 60/72]
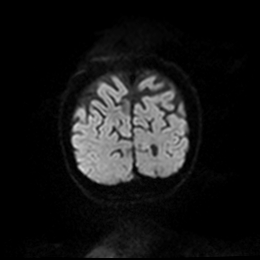
[im 72/72]
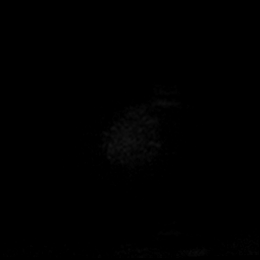

[Series 8: DWI · coronal · 4.0mm · 0.88mm/px · 4 of 36 slices shown (4 of 4)]
[im 1/36]
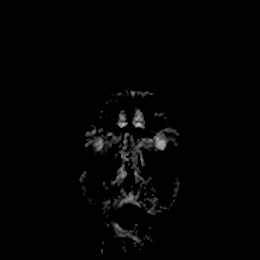
[im 12/36]
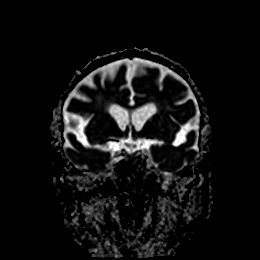
[im 24/36]
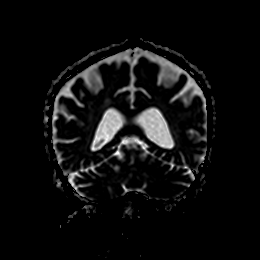
[im 36/36]
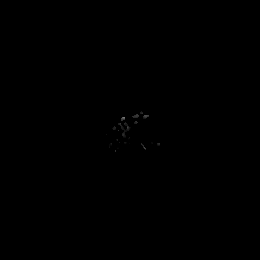

[Series 9: T1 · sagittal · 5.0mm · 0.75mm/px · 2 of 21 slices shown]
[im 1/21]
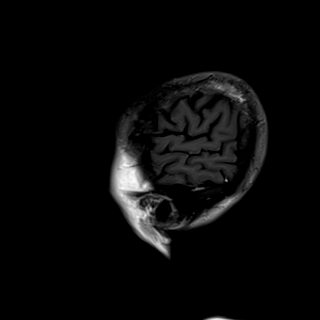
[im 21/21]
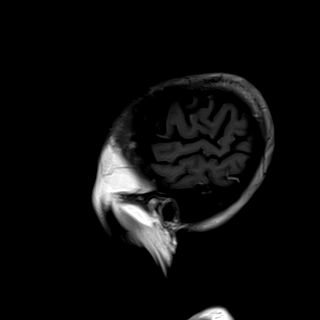

[Series 10: T2 · axial · 5.0mm · 0.72mm/px · z∈[-59,+81]mm · 2 of 25 slices shown (1 of 2)]
[im 1/25]
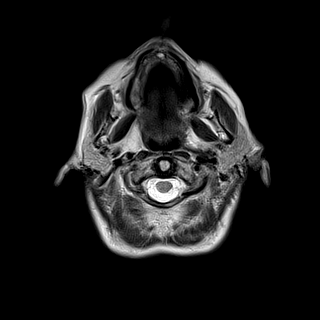
[im 25/25]
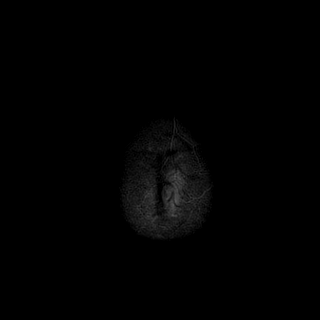

[Series 11: FLAIR · axial · 5.0mm · 0.45mm/px · z∈[-62,+79]mm · 2 of 25 slices shown]
[im 1/25]
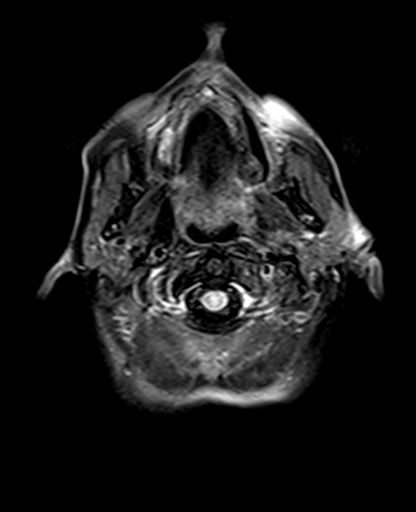
[im 25/25]
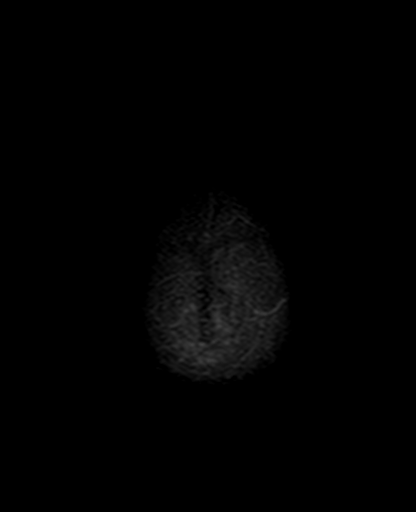

[Series 12: swi_images · axial · 3.0mm · 0.90mm/px · z∈[-81,+90]mm · 6 of 60 slices shown]
[im 1/60]
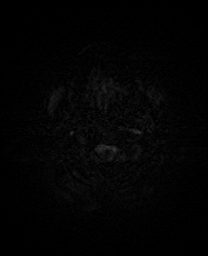
[im 12/60]
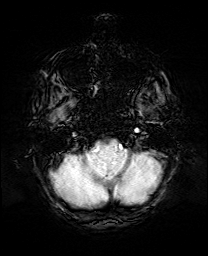
[im 24/60]
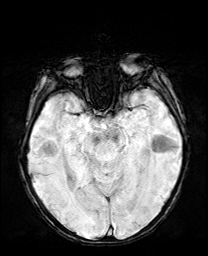
[im 36/60]
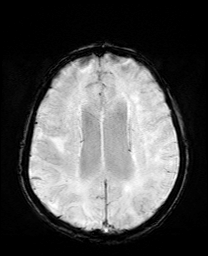
[im 48/60]
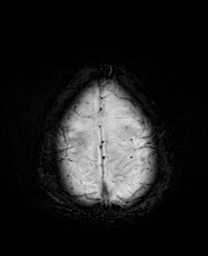
[im 60/60]
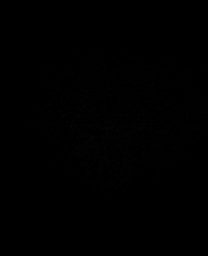

[Series 13: mip_images(sw) · axial · 24.0mm · 0.90mm/px · z∈[-71,+80]mm · 5 of 53 slices shown]
[im 1/53]
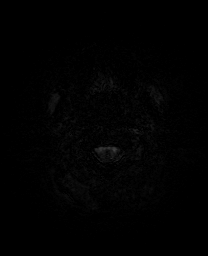
[im 14/53]
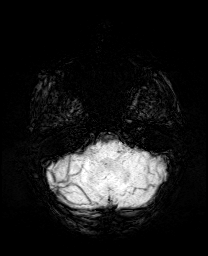
[im 27/53]
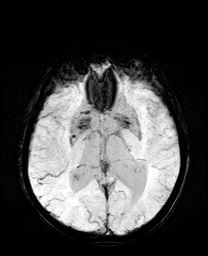
[im 40/53]
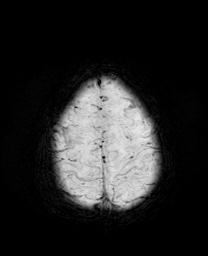
[im 53/53]
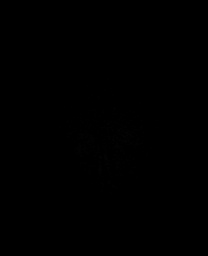

[Series 15: T2 · coronal · 5.0mm · 0.34mm/px · 3 of 26 slices shown (2 of 2)]
[im 1/26]
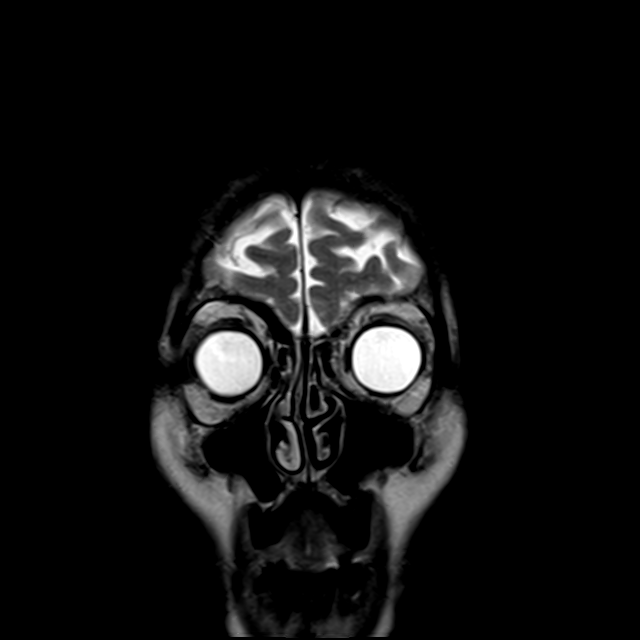
[im 13/26]
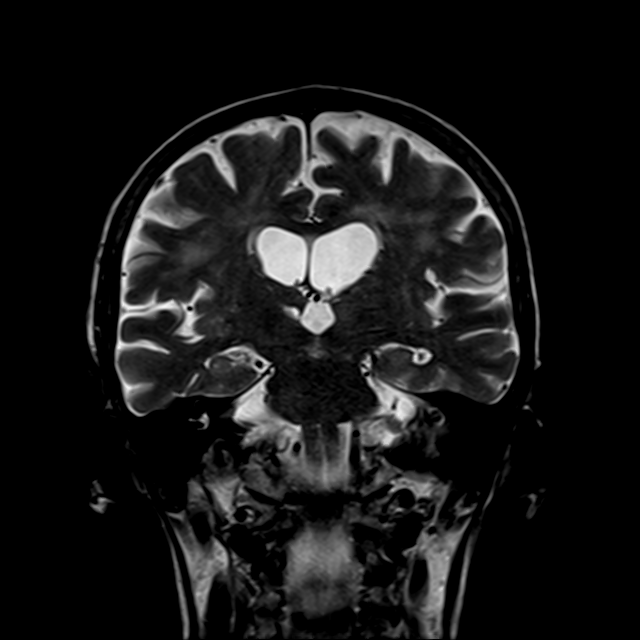
[im 26/26]
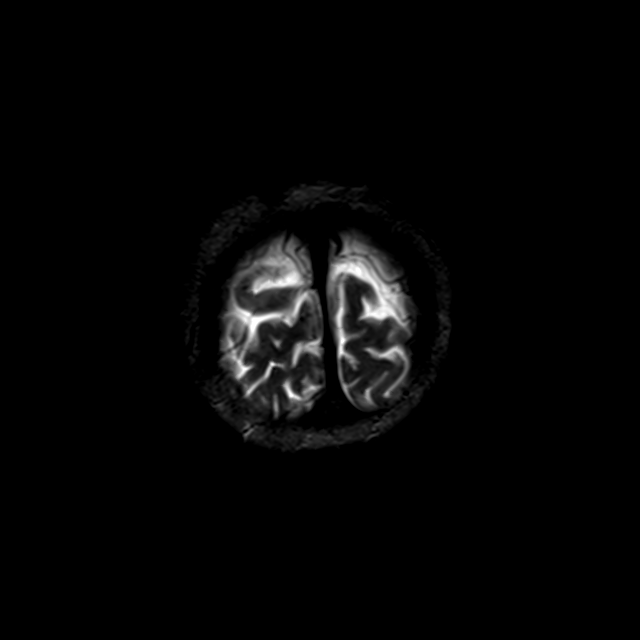

[42 of 48 positions shown; findings below may reference images not displayed]

FINDINGS: BRAIN: There is no acute infarct, acute hemorrhage or mass effect.
The midline structures are normal. There are multiple old cerebellar
infarcts, more so on the right. There are old bilateral ganglia
thalamic infarcts. Early confluent hyperintense T2-weighted signal
of the periventricular and deep white matter, most commonly due to
chronic ischemic microangiopathy. Generalized atrophy without lobar
predilection. Susceptibility-sensitive sequences show no chronic
microhemorrhage or superficial siderosis.

VASCULAR: Major intracranial arterial and venous sinus flow voids
are preserved.

SKULL AND UPPER CERVICAL SPINE: The visualized skull base,
calvarium, upper cervical spine and extracranial soft tissues are
normal.

SINUSES/ORBITS: No fluid levels or advanced mucosal thickening. No
mastoid or middle ear effusion. The orbits are normal.
IMPRESSION: 1. No acute intracranial abnormality.
2. Multiple old cerebellar, basal ganglia and thalamus lacunar
infarcts.
3. Atrophy and chronic ischemic microangiopathy.
